# Patient Record
Sex: Female | Born: 1950 | Race: Black or African American | Hispanic: No | Smoking: Never smoker
Health system: Southern US, Community
[De-identification: ages and names within clinical notes are randomized; demographics above are authoritative.]

## PROBLEM LIST (undated history)

## (undated) DIAGNOSIS — K219 Gastro-esophageal reflux disease without esophagitis: Secondary | ICD-10-CM

## (undated) DIAGNOSIS — M1990 Unspecified osteoarthritis, unspecified site: Secondary | ICD-10-CM

## (undated) DIAGNOSIS — I10 Essential (primary) hypertension: Secondary | ICD-10-CM

## (undated) DIAGNOSIS — E785 Hyperlipidemia, unspecified: Secondary | ICD-10-CM

## (undated) DIAGNOSIS — I519 Heart disease, unspecified: Secondary | ICD-10-CM

## (undated) HISTORY — DX: Gastro-esophageal reflux disease without esophagitis: K21.9

## (undated) HISTORY — PX: BREAST BIOPSY: SHX20

## (undated) HISTORY — PX: REPLACEMENT TOTAL KNEE BILATERAL: SUR1225

## (undated) HISTORY — PX: COLONOSCOPY: SHX174

## (undated) HISTORY — DX: Unspecified osteoarthritis, unspecified site: M19.90

## (undated) HISTORY — DX: Hyperlipidemia, unspecified: E78.5

## (undated) HISTORY — PX: ESOPHAGOGASTRODUODENOSCOPY: SHX1529

## (undated) HISTORY — DX: Essential (primary) hypertension: I10

## (undated) HISTORY — PX: JOINT REPLACEMENT: SHX530

---
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
## 2018-08-11 ENCOUNTER — Telehealth: Payer: Self-pay | Admitting: Internal Medicine

## 2018-08-11 NOTE — Telephone Encounter (Signed)
Copied from [place] [phone]. Topic: Appointment Scheduling - New Patient >> Aug 11, 2018 11:58 AM [name] wrote: New patient would like to be approved as a New Patient and scheduled for your office. Provider: Dr. Madilyn Hook is the wife of Mr. Julia [name] DOB [phone], also one of [name] Pt's  FYI: Pt only takes Amlodipine, Pravastatin and Bystolic and vitamins from previous provider in another area   Route to department's PEC pool.

## 2018-08-12 NOTE — Telephone Encounter (Signed)
Appt scheduled and new patient packet sent

## 2018-08-12 NOTE — Telephone Encounter (Signed)
Ok Thx 

## 2018-09-08 ENCOUNTER — Encounter: Payer: Self-pay | Admitting: Internal Medicine

## 2018-09-08 ENCOUNTER — Ambulatory Visit (INDEPENDENT_AMBULATORY_CARE_PROVIDER_SITE_OTHER): Payer: Medicare Other | Admitting: Internal Medicine

## 2018-09-08 ENCOUNTER — Other Ambulatory Visit: Payer: Self-pay

## 2018-09-08 DIAGNOSIS — Z0000 Encounter for general adult medical examination without abnormal findings: Secondary | ICD-10-CM

## 2018-09-08 DIAGNOSIS — R002 Palpitations: Secondary | ICD-10-CM | POA: Diagnosis not present

## 2018-09-08 DIAGNOSIS — I10 Essential (primary) hypertension: Secondary | ICD-10-CM | POA: Diagnosis not present

## 2018-09-08 DIAGNOSIS — M170 Bilateral primary osteoarthritis of knee: Secondary | ICD-10-CM

## 2018-09-08 DIAGNOSIS — E785 Hyperlipidemia, unspecified: Secondary | ICD-10-CM | POA: Insufficient documentation

## 2018-09-08 MED ORDER — PRAVASTATIN SODIUM 80 MG PO TABS: ORAL_TABLET | ORAL | 3 refills | Status: DC

## 2018-09-08 MED ORDER — AMLODIPINE BESYLATE 10 MG PO TABS: ORAL_TABLET | ORAL | 3 refills | Status: DC

## 2018-09-08 MED ORDER — BYSTOLIC 5 MG PO TABS: ORAL_TABLET | ORAL | 3 refills | Status: DC

## 2018-09-08 NOTE — Assessment & Plan Note (Addendum)
On Pravastatin Obtain labs

## 2018-09-08 NOTE — Patient Instructions (Signed)
If you have medicare related insurance (such as traditional Medicare, Blue Cross Medicare, United HealthCare Medicare, or similar), Please make an appointment at the scheduling desk with Jill, the Wellness Health Coach, for your Wellness visit in this office, which is a benefit with your insurance.  

## 2018-09-08 NOTE — Assessment & Plan Note (Signed)
H/o PAF w/surgery per pt 2017 Occ palpitations Card ref - Dr Stanford Breed

## 2018-09-08 NOTE — Progress Notes (Signed)
Subjective:  Patient ID: [name] [name], female    DOB: January 26, 1951  Age: 68 y.o. MRN: [id number]  CC: No chief complaint on file.   HPI [name] [name] presents for a new pt visit She is relocating from [facility].  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  Her medical records and other labs were reviewed by myself   Outpatient Medications Prior to Visit  Medication Sig Dispense Refill  . amLODipine (NORVASC) 10 MG tablet TK 1 T PO QD    . ASPIRIN 81 PO Take by mouth.    Marland Kitchen BIOTIN PO Take by mouth.    . BYSTOLIC 5 MG tablet TK 1 T PO QHS    . CALCIUM PO Take by mouth.    . Cholecalciferol (VITAMIN D3 PO) Take by mouth.    Marland Kitchen GARLIC PO Take by mouth.    . MULTIPLE VITAMIN PO Take by mouth.    . pravastatin (PRAVACHOL) 80 MG tablet TK 1 T PO QHS    . TURMERIC PO Take by mouth.    Marland Kitchen VITAMIN E PO Take by mouth.     No facility-administered medications prior to visit.     ROS: Review of Systems  Constitutional: Negative for activity change, appetite change, chills, fatigue and unexpected weight change.  HENT: Negative for congestion, mouth sores and sinus pressure.   Eyes: Negative for visual disturbance.  Respiratory: Negative for cough and chest tightness.   Gastrointestinal: Negative for abdominal pain and nausea.  Genitourinary: Negative for difficulty urinating, frequency and vaginal pain.  Musculoskeletal: Negative for back pain and gait problem.  Skin: Negative for pallor and rash.  Neurological: Negative for dizziness, tremors, weakness, numbness and headaches.  Psychiatric/Behavioral: Negative for confusion, sleep disturbance and suicidal ideas.     Objective:  BP 124/72 (BP Location: Left Arm, Patient Position: Sitting, Cuff Size: Large)   Pulse (!) 51   Temp 98.1 F (36.7 C) (Oral)   Ht 5' 7.5" (1.715 m)   Wt 198 lb (89.8 kg)   SpO2 97%   BMI 30.55 kg/m   BP Readings from Last 3 Encounters:  09/08/18 124/72    Wt Readings from Last 3 Encounters:  09/08/18 198 lb (89.8 kg)    Physical Exam Constitutional:      General: She is not in acute distress.    Appearance: She is well-developed.  HENT:     Head: Normocephalic.     Right Ear: External ear normal.     Left Ear: External ear normal.     Nose: Nose normal.  Eyes:     General:        Right eye: No discharge.        Left eye: No discharge.     Conjunctiva/sclera: Conjunctivae normal.     Pupils: Pupils are equal, round, and reactive to light.  Neck:     Musculoskeletal: Normal range of motion and neck supple.     Thyroid: No thyromegaly.     Vascular: No JVD.     Trachea: No tracheal deviation.  Cardiovascular:     Rate and Rhythm: Normal rate and regular rhythm.  Heart sounds: Normal heart sounds.  Pulmonary:     Effort: No respiratory distress.     Breath sounds: No stridor. No wheezing.  Abdominal:     General: Bowel sounds are normal. There is no distension.     Palpations: Abdomen is soft. There is no mass.     Tenderness: There is no abdominal tenderness. There is no guarding or rebound.  Musculoskeletal:        General: No tenderness.  Lymphadenopathy:     Cervical: No cervical adenopathy.  Skin:    Findings: No erythema or rash.  Neurological:     Cranial Nerves: No cranial nerve deficit.     Motor: No abnormal muscle tone.     Coordination: Coordination normal.     Deep Tendon Reflexes: Reflexes normal.  Psychiatric:        Behavior: Behavior normal.        Thought Content: Thought content normal.        Judgment: Judgment normal.   Both knees with surgical scars  No results found for: WBC, HGB, HCT, PLT, GLUCOSE, CHOL, TRIG, HDL,  LDLDIRECT, LDLCALC, ALT, AST, NA, K, CL, CREATININE, BUN, CO2, TSH, PSA, INR, GLUF, HGBA1C, MICROALBUR  Patient was never admitted.  Assessment & Plan:    Sonda PrimesAlex Kele Withem, MD

## 2018-09-12 ENCOUNTER — Encounter: Payer: Self-pay | Admitting: Internal Medicine

## 2018-09-12 DIAGNOSIS — M1990 Unspecified osteoarthritis, unspecified site: Secondary | ICD-10-CM | POA: Insufficient documentation

## 2018-09-12 DIAGNOSIS — Z0000 Encounter for general adult medical examination without abnormal findings: Secondary | ICD-10-CM | POA: Insufficient documentation

## 2018-09-12 NOTE — Assessment & Plan Note (Signed)
Chronic.  Status post bilateral total knee replacement

## 2018-09-12 NOTE — Assessment & Plan Note (Addendum)
Continue with amlodipine, Bystolic.  Lab work

## 2018-09-12 NOTE — Assessment & Plan Note (Signed)
C/o OA for a long time- s/p B TKR She is status post colonoscopy 2015, EGD 2012 She had a Pap smear PAP in 2019 She had an Ophthalmology exam in 2020 Schedule a Medicare well exam with Sharee Pimple

## 2019-02-06 DIAGNOSIS — Z789 Other specified health status: Secondary | ICD-10-CM | POA: Insufficient documentation

## 2019-02-06 NOTE — Progress Notes (Signed)
Cardiology Office Note   Date:  02/08/2019   ID:  Julia [name], DOB 1950-10-26, MRN [id number]  PCP:  Cassandria Anger, MD  Cardiologist:   No primary care provider on file. Referring:  Plotnikov, Evie Lacks, MD  Chief Complaint  Patient presents with  . Palpitations      History of Present Illness: Julia [name] is a 68 y.o. female who is referred by Plotnikov, Evie Lacks, MD for evaluation of palpitations.     I was able to review records from [place].  She has a history of paroxysmal atrial fibrillation with rapid ventricular rate.  I did see a stress perfusion study from 2019 with an EF of 66% and no evidence of ischemia.  I do see a Holter report with some evidence of tachycardia suggestive of atrial tachycardia and also short runs in therapy.  She had a CT angiography of her coronaries with no significant disease.  She did have a calcium score of 38.2.  She says that this work-up started when she can get a knee surgery and told him about palpitations.  She was told that the fibrillation she is and was and gabapentin.  As I look through her event monitor I do not see any evidence fibrillation is a brief run of tachycardia.  She otherwise is not really having a lot of palpitations.  She denies any chest pressure, neck or arm discomfort.  She does a lot of exercising.  She is not having any shortness of breath, PND or orthopnea.  She has no weight gain or edema.   Past Medical History:  Diagnosis Date  . Arthritis   . GERD (gastroesophageal reflux disease)   . Hyperlipidemia   . Hypertension     Past Surgical History:  Procedure Laterality Date  . BREAST BIOPSY    . JOINT REPLACEMENT     Both knees     Current Outpatient Medications  Medication Sig Dispense Refill  . amLODipine (NORVASC) 10 MG tablet TK 1 T PO QD 90 tablet 3  . ASPIRIN 81 PO Take by mouth.    Marland Kitchen BIOTIN PO Take by mouth.    . BYSTOLIC 5 MG tablet TK 1 T PO QHS 90 tablet 3  . CALCIUM PO Take by  mouth.    . Cholecalciferol (VITAMIN D3 PO) Take by mouth.    . Ferrous Gluconate (IRON 27 PO) Take by mouth.    Marland Kitchen GARLIC PO Take by mouth.    . MULTIPLE VITAMIN PO Take by mouth.    . Multiple Vitamins-Minerals (CENTRUM SILVER PO) Take by mouth.    . pravastatin (PRAVACHOL) 80 MG tablet TK 1 T PO QHS 90 tablet 3  . TURMERIC PO Take by mouth.    Marland Kitchen VITAMIN E PO Take by mouth.     No current facility-administered medications for this visit.    Allergies:   Patient has no allergy information on record.    Social History:  The patient  reports that she has never smoked. She has never used smokeless tobacco. She reports that she does not drink alcohol or use drugs.   Family History:  The patient's family history includes Arthritis in her mother; Cancer in her father; Diabetes in her sister; Drug abuse in her brother; Heart disease in her brother and mother; Hyperlipidemia in her mother; Hypertension in her brother, mother, and sister.    ROS:  Please see the history of present illness.   Otherwise, review of systems are  positive for none.   All other systems are reviewed and negative.    PHYSICAL EXAM: VS:  BP (!) 143/73   Pulse (!) 59   Temp (!) 97.3 F (36.3 C)   Ht 5' 7.5" (1.715 m)   Wt 205 lb (93 kg)   SpO2 99%   BMI 31.63 kg/m  , BMI Body mass index is 31.63 kg/m. GENERAL:  Well appearing HEENT:  Pupils equal round and reactive, fundi not visualized, oral mucosa unremarkable NECK:  No jugular venous distention, waveform within normal limits, carotid upstroke brisk and symmetric, no bruits, no thyromegaly LYMPHATICS:  No cervical, inguinal adenopathy LUNGS:  Clear to auscultation bilaterally BACK:  No CVA tenderness CHEST:  Unremarkable HEART:  PMI not displaced or sustained,S1 and S2 within normal limits, no S3, no S4, no clicks, no rubs, no murmurs ABD:  Flat, positive bowel sounds normal in frequency in pitch, no bruits, no rebound, no guarding, no midline pulsatile  mass, no hepatomegaly, no splenomegaly EXT:  2 plus pulses throughout, no edema, no cyanosis no clubbing SKIN:  No rashes no nodules NEURO:  Cranial nerves II through XII grossly intact, motor grossly intact throughout PSYCH:  Cognitively intact, oriented to person place and time    EKG:  EKG is ordered today. The ekg ordered today demonstrates sinus rhythm, rate 55, axis within normal limits, intervals within normal limits, no acute ST-T wave changes.   Recent Labs: No results found for requested labs within last 8760 hours.    Lipid Panel No results found for: CHOL, TRIG, HDL, CHOLHDL, VLDL, LDLCALC, LDLDIRECT    Wt Readings from Last 3 Encounters:  02/08/19 205 lb (93 kg)  09/08/18 198 lb (89.8 kg)      Other studies Reviewed: Additional studies/ records that were reviewed today include: Extensive records from [place]. Review of the above records demonstrates:  Please see elsewhere in the note.     ASSESSMENT AND PLAN:  PALPITATIONS:   She has had documented rhythms as above but these are not particularly problematic.  At this point no change in therapy.  CORONARY CALCIUM: She had no obstructive coronary disease.  We talked about risk reduction as below.  HTN: Her blood pressure is controlled.  No change in therapy.  DYSLIPIDEMIA: She does have an elevated LDL.  She is on some pravastatin.  However, we had a long conversation about diet.  She is experiencing better with this.  LDL was 136 we talked about goal of around 100.  COVID EDUCATION: We talked about the vaccine.  Current medicines are reviewed at length with the patient today.  The patient does not have concerns regarding medicines.  The following changes have been made:  no change  Labs/ tests ordered today include: None  Orders Placed This Encounter  Procedures  . EKG 12-Lead     Disposition:   FU with me in one year.     Signed, Rollene Rotunda, MD  02/08/2019 10:44 AM    [facility]

## 2019-02-08 ENCOUNTER — Other Ambulatory Visit: Payer: Self-pay

## 2019-02-08 ENCOUNTER — Encounter: Payer: Self-pay | Admitting: Cardiology

## 2019-02-08 ENCOUNTER — Ambulatory Visit (INDEPENDENT_AMBULATORY_CARE_PROVIDER_SITE_OTHER): Payer: Medicare Other | Admitting: Cardiology

## 2019-02-08 ENCOUNTER — Encounter (INDEPENDENT_AMBULATORY_CARE_PROVIDER_SITE_OTHER): Payer: Self-pay

## 2019-02-08 VITALS — BP 143/73 | HR 59 | Temp 97.3°F | Ht 67.5 in | Wt 205.0 lb

## 2019-02-08 DIAGNOSIS — Z789 Other specified health status: Secondary | ICD-10-CM | POA: Diagnosis not present

## 2019-02-08 DIAGNOSIS — R002 Palpitations: Secondary | ICD-10-CM | POA: Diagnosis not present

## 2019-02-08 NOTE — Patient Instructions (Signed)
Medication Instructions:  Your physician recommends that you continue on your current medications as directed. Please refer to the Current Medication list given to you today.  *If you need a refill on your cardiac medications before your next appointment, please call your pharmacy*  Lab Work: none If you have labs (blood work) drawn today and your tests are completely normal, you will receive your results only by: . MyChart Message (if you have MyChart) OR . A paper copy in the mail If you have any lab test that is abnormal or we need to change your treatment, we will call you to review the results.  Testing/Procedures: none  Follow-Up: At [facility], you and your health needs are our priority.  As part of our continuing mission to provide you with exceptional heart care, we have created designated Provider Care Teams.  These Care Teams include your primary Cardiologist (physician) and Advanced Practice Providers (APPs -  Physician Assistants and Nurse Practitioners) who all work together to provide you with the care you need, when you need it.  Your next appointment:   12 month(s)  The format for your next appointment:   Either In Person or Virtual  Provider:   DR. HOCHREIN 

## 2019-03-13 ENCOUNTER — Ambulatory Visit (INDEPENDENT_AMBULATORY_CARE_PROVIDER_SITE_OTHER): Payer: Medicare Other | Admitting: Internal Medicine

## 2019-03-13 ENCOUNTER — Encounter: Payer: Self-pay | Admitting: Internal Medicine

## 2019-03-13 ENCOUNTER — Other Ambulatory Visit: Payer: Self-pay

## 2019-03-13 DIAGNOSIS — I2510 Atherosclerotic heart disease of native coronary artery without angina pectoris: Secondary | ICD-10-CM | POA: Diagnosis not present

## 2019-03-13 DIAGNOSIS — R002 Palpitations: Secondary | ICD-10-CM

## 2019-03-13 DIAGNOSIS — I10 Essential (primary) hypertension: Secondary | ICD-10-CM | POA: Diagnosis not present

## 2019-03-13 DIAGNOSIS — E785 Hyperlipidemia, unspecified: Secondary | ICD-10-CM | POA: Diagnosis not present

## 2019-03-13 DIAGNOSIS — M170 Bilateral primary osteoarthritis of knee: Secondary | ICD-10-CM

## 2019-03-13 DIAGNOSIS — I2583 Coronary atherosclerosis due to lipid rich plaque: Secondary | ICD-10-CM

## 2019-03-13 MED ORDER — VITAMIN D3 50 MCG (2000 UT) PO CAPS: 2000.0000 [IU] | ORAL_CAPSULE | Freq: Every day | ORAL | 3 refills | Status: AC

## 2019-03-13 NOTE — Progress Notes (Signed)
Subjective:  Patient ID: Julia [name], female    DOB: 1950-09-30  Age: 69 y.o. MRN: [id number]  CC: No chief complaint on file.   HPI Julia [name] presents for HTN, OA, dyslipidemia C/o L foot pain and burning C/o being achy all the time  Outpatient Medications Prior to Visit  Medication Sig Dispense Refill  . amLODipine (NORVASC) 10 MG tablet TK 1 T PO QD 90 tablet 3  . ASPIRIN 81 PO Take by mouth.    Marland Kitchen BIOTIN PO Take by mouth.    . BYSTOLIC 5 MG tablet TK 1 T PO QHS 90 tablet 3  . CALCIUM PO Take by mouth.    . Cholecalciferol (VITAMIN D3 PO) Take by mouth.    . Ferrous Gluconate (IRON 27 PO) Take by mouth.    Marland Kitchen GARLIC PO Take by mouth.    . MULTIPLE VITAMIN PO Take by mouth.    . Multiple Vitamins-Minerals (CENTRUM SILVER PO) Take by mouth.    . pravastatin (PRAVACHOL) 80 MG tablet TK 1 T PO QHS 90 tablet 3  . TURMERIC PO Take by mouth.    Marland Kitchen VITAMIN E PO Take by mouth.     No facility-administered medications prior to visit.    ROS: Review of Systems  Constitutional: Positive for unexpected weight change. Negative for activity change, appetite change, chills and fatigue.  HENT: Negative for congestion, mouth sores and sinus pressure.   Eyes: Negative for visual disturbance.  Respiratory: Negative for cough and chest tightness.   Gastrointestinal: Negative for abdominal pain and nausea.  Genitourinary: Negative for difficulty urinating, frequency and vaginal pain.  Musculoskeletal: Positive for arthralgias. Negative for back pain and gait problem.  Skin: Negative for pallor and rash.  Neurological: Negative for dizziness, tremors, weakness, numbness and headaches.  Psychiatric/Behavioral: Negative for confusion, sleep disturbance and suicidal ideas.    Objective:  BP 130/80 (BP Location: Left Arm, Patient Position: Sitting, Cuff Size: Large)   Pulse (!) 58   Temp 98.6 F (37 C) (Oral)   Ht 5' 7.5" (1.715 m)   Wt 209 lb (94.8 kg)   SpO2 98%   BMI 32.25 kg/m    BP Readings from Last 3 Encounters:  03/13/19 130/80  02/08/19 (!) 143/73  09/08/18 124/72    Wt Readings from Last 3 Encounters:  03/13/19 209 lb (94.8 kg)  02/08/19 205 lb (93 kg)  09/08/18 198 lb (89.8 kg)    Physical Exam Constitutional:      General: She is not in acute distress.    Appearance: She is well-developed. She is obese.  HENT:     Head: Normocephalic.     Right Ear: External ear normal.     Left Ear: External ear normal.     Nose: Nose normal.  Eyes:     General:        Right eye: No discharge.        Left eye: No discharge.     Conjunctiva/sclera: Conjunctivae normal.     Pupils: Pupils are equal, round, and reactive to light.  Neck:     Thyroid: No thyromegaly.     Vascular: No JVD.     Trachea: No tracheal deviation.  Cardiovascular:     Rate and Rhythm: Normal rate and regular rhythm.     Heart sounds: Normal heart sounds.  Pulmonary:     Effort: No respiratory distress.     Breath sounds: No stridor. No wheezing.  Abdominal:  General: Bowel sounds are normal. There is no distension.     Palpations: Abdomen is soft. There is no mass.     Tenderness: There is no abdominal tenderness. There is no guarding or rebound.  Musculoskeletal:        General: No tenderness.     Cervical back: Normal range of motion and neck supple.  Lymphadenopathy:     Cervical: No cervical adenopathy.  Skin:    Findings: No erythema or rash.  Neurological:     Mental Status: She is oriented to person, place, and time.     Cranial Nerves: No cranial nerve deficit.     Motor: No abnormal muscle tone.     Coordination: Coordination normal.     Deep Tendon Reflexes: Reflexes normal.  Psychiatric:        Behavior: Behavior normal.        Thought Content: Thought content normal.        Judgment: Judgment normal.   L foot w/pain  No results found for: WBC, HGB, HCT, PLT, GLUCOSE, CHOL, TRIG, HDL, LDLDIRECT, LDLCALC, ALT, AST, NA, K, CL, CREATININE, BUN, CO2,  TSH, PSA, INR, GLUF, HGBA1C, MICROALBUR  Patient was never admitted.  Assessment & Plan:    Walker Kehr, MD

## 2019-03-13 NOTE — Assessment & Plan Note (Signed)
Per Dr Antoine Poche: "She has a history of paroxysmal atrial fibrillation with rapid ventricular rate.  I did see a stress perfusion study from 2019 with an EF of 66% and no evidence of ischemia.  I do see a Holter report with some evidence of tachycardia suggestive of atrial tachycardia and also short runs in therapy.  She had a CT angiography of her coronaries with no significant disease.  She did have a calcium score of 38.2."

## 2019-03-13 NOTE — Assessment & Plan Note (Signed)
Per Dr Hochrein: "She has a history of paroxysmal atrial fibrillation with rapid ventricular rate.  I did see a stress perfusion study from 2019 with an EF of 66% and no evidence of ischemia.  I do see a Holter report with some evidence of tachycardia suggestive of atrial tachycardia and also short runs in therapy.  She had a CT angiography of her coronaries with no significant disease.  She did have a calcium score of 38.2." 

## 2019-03-13 NOTE — Patient Instructions (Addendum)
Hold PRAVASTATIN x 2 weeks   These suggestions will probably help you to improve your metabolism if you are not overweight and to lose weight if you are overweight: 1.  Reduce your consumption of sugars and starches.  Eliminate high fructose corn syrup from your diet.  Reduce your consumption of processed foods.  For desserts try to have seasonal fruits, berries, nuts, cheeses or dark chocolate with more than 70% cacao. 2.  Do not snack 3.  You do not have to eat breakfast.  If you choose to have breakfast-eat plain greek yogurt, eggs, oatmeal (without sugar) 4.  Drink water, freshly brewed unsweetened tea (green, black or herbal) or coffee.  Do not drink sodas including diet sodas , juices, beverages sweetened with artificial sweeteners. 5.  Reduce your consumption of refined grains. 6.  Avoid protein drinks such as Optifast, Slim fast etc. Eat chicken, fish, meat, dairy and beans for your sources of protein 7.  Natural unprocessed fats like cold pressed virgin olive oil, butter, coconut oil are good for you.  Eat avocados 8.  Increase your consumption of fiber.  Fruits, berries, vegetables, whole grains, flaxseeds, Chia seeds, beans, popcorn, nuts, oatmeal are good sources of fiber 9.  Use vinegar in your diet, i.e. apple cider vinegar, red wine or balsamic vinegar 10.  You can try fasting.  For example you can skip breakfast and lunch every other day (24-hour fast) 11.  Stress reduction, good night sleep, relaxation, meditation, yoga and other physical activity is likely to help you to maintain low weight too. 12.  If you drink alcohol, limit your alcohol intake to no more than 2 drinks a day.   We are committed to keeping you informed about the COVID-19 vaccine.  As the vaccine continues to become available for each phase, we will ensure that patients who meet the criteria receive the information they need to access vaccination opportunities. Continue to check your MyChart account and  [email or web address] for updates. Please review the Phase 1b information below.  Following [place] [employer]'s guidelines for the distribution of COVID-19 vaccines we are pleased to share our plans to begin offering vaccines to those 75 and older (Phase 1b). Here are details of those plans:  [place] On Tuesday, Jan. 19, the [facility]) and [place] will begin large-scale COVID-19 vaccinations at the [facility]. The vaccinations are appointment only and for those 62 and older.  It is expected that 750 will initially be vaccinated per day at the [facility]. Capacity is expected to grow in the weeks ahead. However, the number of reservations accepted depends on the amount of vaccine available.  Online or Phone Registration Only Walk-ins will NOT be accepted.  [place] will open registration to local residents on Friday, January 15. The [facility] Department will announce additional appointments at a later date.   Health Department Registration [facility] residents only)  [place] (Any local residents)  Other Counties We are also working in partnership with county health agencies in [place], [place] and [place] counties to ensure continuing vaccination availability in alignment with state guidelines in the weeks and months ahead. Information on phase 1b COVID-19 vaccination clinics being offered by local county health agencies is provided in the website links below for your convenience:  [place]'s phase 1b vaccination guidelines, prioritizing those 75 and over as the next  eligible group to receive the COVID-19 vaccine, are detailed at https://www.carlson.net/.   Vaccine Safety and Effectiveness Clinical trials for the Pfizer COVID-19 vaccine involved 42,000 people and showed  that the vaccine is more than 95% effective in preventing COVID-19 with no serious safety concerns. Similar results have been reported for the Moderna COVID-19 vaccine. Side effects reported in the Pfizer clinical trials include a sore arm at the injection site, fatigue, headache, chills and fever. While side effects from the Pfizer COVID-19 vaccine are higher than for a typical flu vaccine, they are lower in many ways than side effects from the leading vaccine to prevent shingles. Side effects are signs that a vaccine is working and are related to your immune system being stimulated to produce antibodies against infection. Side effects from vaccination are far less significant than health impacts from COVID-19.  Staying Informed Pharmacists, infectious disease doctors, critical care nurses and other experts at [facility] continue to speak publicly through media interviews and direct communication with our patients and communities about the safety, effectiveness and importance of vaccines to eliminate COVID-19. In addition, reliable information on vaccine safety, effectiveness, side effects and more is available on the following websites:  [place] Department of Health and Human Services COVID-19 Vaccine Information Website.  U.S. Centers for Disease Control and Prevention COVID-19 Librarian, academic.  Staying Safe We agree with the CDC on what we can do to help our communities get back to normal: Getting "back to normal" is going to take all of our tools. If we use all the tools we have, we stand the best chance of getting our families, communities, schools and workplaces "back to normal" sooner:  Get vaccinated as soon as vaccines become available within the phase of the state's vaccination rollout plan for which you meet the eligibility criteria.  Wear a mask.  Stay 6 feet from others and avoid crowds.  Wash hands often.  For our most current information, please visit  [email or web address]

## 2019-03-13 NOTE — Assessment & Plan Note (Signed)
Hold Pravastatin x 2 wks Pt had a CTcoronary angio in [facility] 2020 Dr Antoine Poche

## 2019-03-13 NOTE — Assessment & Plan Note (Signed)
Worse Hold Pravastatin x 2 weeks

## 2019-03-24 ENCOUNTER — Telehealth: Payer: Self-pay | Admitting: Cardiology

## 2019-03-24 NOTE — Telephone Encounter (Signed)
We are recommending the COVID-19 vaccine to all of our patients. Cardiac medications (including blood thinners) should not deter anyone from being vaccinated and there is no need to hold any of those medications prior to vaccine administration.     Currently, there is a hotline to call (active 03/03/19) to schedule vaccination appointments as no walk-ins will be accepted.   Number: [phone].    If an appointment is not available please go to [email or web address] to sign up for notification when additional vaccine appointments are available.   If you have further questions or concerns about the vaccine process, please visit [email or web address] or contact your primary care physician.  Verbalized Understanding. 

## 2019-04-26 ENCOUNTER — Ambulatory Visit (INDEPENDENT_AMBULATORY_CARE_PROVIDER_SITE_OTHER): Payer: Medicare Other | Admitting: Internal Medicine

## 2019-04-26 ENCOUNTER — Telehealth: Payer: Self-pay

## 2019-04-26 ENCOUNTER — Other Ambulatory Visit: Payer: Self-pay

## 2019-04-26 ENCOUNTER — Encounter: Payer: Self-pay | Admitting: Internal Medicine

## 2019-04-26 DIAGNOSIS — E785 Hyperlipidemia, unspecified: Secondary | ICD-10-CM

## 2019-04-26 DIAGNOSIS — B359 Dermatophytosis, unspecified: Secondary | ICD-10-CM

## 2019-04-26 DIAGNOSIS — I10 Essential (primary) hypertension: Secondary | ICD-10-CM | POA: Diagnosis not present

## 2019-04-26 DIAGNOSIS — M170 Bilateral primary osteoarthritis of knee: Secondary | ICD-10-CM | POA: Diagnosis not present

## 2019-04-26 MED ORDER — LIVALO 1 MG PO TABS: 1.0000 mg | ORAL_TABLET | Freq: Every day | ORAL | 3 refills | Status: DC

## 2019-04-26 MED ORDER — AMLODIPINE BESYLATE 10 MG PO TABS: ORAL_TABLET | ORAL | 3 refills | Status: DC

## 2019-04-26 MED ORDER — BYSTOLIC 5 MG PO TABS: ORAL_TABLET | ORAL | 3 refills | Status: DC

## 2019-04-26 MED ORDER — KETOCONAZOLE 2 % EX CREA: 1.0000 "application " | TOPICAL_CREAM | Freq: Two times a day (BID) | CUTANEOUS | 1 refills | Status: DC

## 2019-04-26 MED ORDER — TRIAMCINOLONE ACETONIDE 0.5 % EX OINT: 1.0000 "application " | TOPICAL_OINTMENT | Freq: Four times a day (QID) | CUTANEOUS | 0 refills | Status: DC

## 2019-04-26 NOTE — Assessment & Plan Note (Addendum)
Vs contact allergy to nickel  Remove FitBit Use Ketoconazole cream Triamcinolone ointment if not

## 2019-04-26 NOTE — Assessment & Plan Note (Signed)
Handicapped form filled out 

## 2019-04-26 NOTE — Telephone Encounter (Signed)
1.Medication Requested:  amLODipine (NORVASC) 10 MG tablet  BYSTOLIC 5 MG tablet  2. Pharmacy (Name, Street, [place]): [facility] on [address]   3. On Med List: Yes  4. Last Visit with PCP:  3.3.21   5. Next visit date with PCP: 6.3.2021   6. 90 day supply   Agent: Please be advised that RX refills may take up to 3 business days. We ask that you follow-up with your pharmacy.

## 2019-04-26 NOTE — Assessment & Plan Note (Signed)
Amlodipine, Bystolic

## 2019-04-26 NOTE — Telephone Encounter (Signed)
RXs sent.

## 2019-04-26 NOTE — Assessment & Plan Note (Signed)
Start Pitavastatin.

## 2019-04-26 NOTE — Progress Notes (Signed)
Subjective:  Patient ID: Julia [name], female    DOB: [birth year]  Age: 69 y.o. MRN: [id number]  CC: No chief complaint on file.   HPI Julia [name] presents for R wrist rash x 2 wks - itching F/u myalgias - much better off Pravachol F/u dyslipidemia  Outpatient Medications Prior to Visit  Medication Sig Dispense Refill  . amLODipine (NORVASC) 10 MG tablet TK 1 T PO QD 90 tablet 3  . ASPIRIN 81 PO Take by mouth.    Marland Kitchen BIOTIN PO Take by mouth.    . BYSTOLIC 5 MG tablet TK 1 T PO QHS 90 tablet 3  . CALCIUM PO Take by mouth.    . Cholecalciferol (VITAMIN D3) 50 MCG (2000 UT) capsule Take 1 capsule (2,000 Units total) by mouth daily. 100 capsule 3  . Ferrous Gluconate (IRON 27 PO) Take by mouth.    Marland Kitchen GARLIC PO Take by mouth.    . MULTIPLE VITAMIN PO Take by mouth.    . Multiple Vitamins-Minerals (CENTRUM SILVER PO) Take by mouth.    . TURMERIC PO Take by mouth.    Marland Kitchen VITAMIN E PO Take by mouth.    . pravastatin (PRAVACHOL) 80 MG tablet TK 1 T PO QHS (Patient not taking: Reported on 04/26/2019) 90 tablet 3   No facility-administered medications prior to visit.    ROS: Review of Systems  Constitutional: Positive for fatigue. Negative for activity change, appetite change, chills and unexpected weight change.  HENT: Negative for congestion, mouth sores and sinus pressure.   Eyes: Negative for visual disturbance.  Respiratory: Negative for cough and chest tightness.   Gastrointestinal: Negative for abdominal pain and nausea.  Genitourinary: Negative for difficulty urinating, frequency and vaginal pain.  Musculoskeletal: Positive for back pain. Negative for gait problem.  Skin: Positive for rash. Negative for pallor.  Neurological: Negative for dizziness, tremors, weakness, numbness and headaches.  Psychiatric/Behavioral: Negative for confusion, sleep disturbance and suicidal ideas.    Objective:  BP 136/78 (BP Location: Left Arm, Patient Position: Sitting, Cuff Size: Large)    Pulse (!) 54   Temp 98.5 F (36.9 C) (Oral)   Ht 5' 7.5" (1.715 m)   Wt 211 lb (95.7 kg)   SpO2 97%   BMI 32.56 kg/m   BP Readings from Last 3 Encounters:  04/26/19 136/78  03/13/19 130/80  02/08/19 (!) 143/73    Wt Readings from Last 3 Encounters:  04/26/19 211 lb (95.7 kg)  03/13/19 209 lb (94.8 kg)  02/08/19 205 lb (93 kg)    Physical Exam Constitutional:      General: She is not in acute distress.    Appearance: She is well-developed. She is obese.  HENT:     Head: Normocephalic.     Right Ear: External ear normal.     Left Ear: External ear normal.     Nose: Nose normal.  Eyes:     General:        Right eye: No discharge.        Left eye: No discharge.     Conjunctiva/sclera: Conjunctivae normal.     Pupils: Pupils are equal, round, and reactive to light.  Neck:     Thyroid: No thyromegaly.     Vascular: No JVD.     Trachea: No tracheal deviation.  Cardiovascular:     Rate and Rhythm: Normal rate and regular rhythm.     Heart sounds: Normal heart sounds.  Pulmonary:     Effort: No  respiratory distress.     Breath sounds: No stridor. No wheezing.  Abdominal:     General: Bowel sounds are normal. There is no distension.     Palpations: Abdomen is soft. There is no mass.     Tenderness: There is no abdominal tenderness. There is no guarding or rebound.  Musculoskeletal:        General: Tenderness present.     Cervical back: Normal range of motion and neck supple.  Lymphadenopathy:     Cervical: No cervical adenopathy.  Skin:    Findings: Rash present. No erythema.  Neurological:     Mental Status: She is oriented to person, place, and time.     Cranial Nerves: No cranial nerve deficit.     Motor: No abnormal muscle tone.     Coordination: Coordination normal.     Gait: Gait abnormal.     Deep Tendon Reflexes: Reflexes normal.  Psychiatric:        Behavior: Behavior normal.        Thought Content: Thought content normal.        Judgment: Judgment  normal.   R wrist rash patch Stiff knees, LS spine     Assessment & Plan:    Sonda Primes, MD

## 2019-04-26 NOTE — Patient Instructions (Addendum)
Remove FitBit Use Ketoconazole cream x 1 week Triamcinolone ointment if not better

## 2019-06-09 ENCOUNTER — Telehealth: Payer: Self-pay

## 2019-06-09 NOTE — Telephone Encounter (Signed)
Key: [id number]

## 2019-07-27 ENCOUNTER — Ambulatory Visit (INDEPENDENT_AMBULATORY_CARE_PROVIDER_SITE_OTHER): Payer: Medicare Other | Admitting: Internal Medicine

## 2019-07-27 ENCOUNTER — Other Ambulatory Visit: Payer: Self-pay

## 2019-07-27 ENCOUNTER — Encounter: Payer: Self-pay | Admitting: Internal Medicine

## 2019-07-27 DIAGNOSIS — I10 Essential (primary) hypertension: Secondary | ICD-10-CM

## 2019-07-27 DIAGNOSIS — I2583 Coronary atherosclerosis due to lipid rich plaque: Secondary | ICD-10-CM | POA: Diagnosis not present

## 2019-07-27 DIAGNOSIS — E785 Hyperlipidemia, unspecified: Secondary | ICD-10-CM | POA: Diagnosis not present

## 2019-07-27 DIAGNOSIS — I2510 Atherosclerotic heart disease of native coronary artery without angina pectoris: Secondary | ICD-10-CM

## 2019-07-27 LAB — CBC WITH DIFFERENTIAL/PLATELET
Basophils Absolute: 0 10*3/uL (ref 0.0–0.1)
Basophils Relative: 1.2 % (ref 0.0–3.0)
Eosinophils Absolute: 0.1 10*3/uL (ref 0.0–0.7)
Eosinophils Relative: 4 % (ref 0.0–5.0)
HCT: 37 % (ref 36.0–46.0)
Hemoglobin: 12.3 g/dL (ref 12.0–15.0)
Lymphocytes Relative: 35.1 % (ref 12.0–46.0)
Lymphs Abs: 1.2 10*3/uL (ref 0.7–4.0)
MCHC: 33.3 g/dL (ref 30.0–36.0)
MCV: 90 fl (ref 78.0–100.0)
Monocytes Absolute: 0.4 10*3/uL (ref 0.1–1.0)
Monocytes Relative: 10.2 % (ref 3.0–12.0)
Neutro Abs: 1.7 10*3/uL (ref 1.4–7.7)
Neutrophils Relative %: 49.5 % (ref 43.0–77.0)
Platelets: 238 10*3/uL (ref 150.0–400.0)
RBC: 4.11 Mil/uL (ref 3.87–5.11)
RDW: 14 % (ref 11.5–15.5)
WBC: 3.5 10*3/uL — ABNORMAL LOW (ref 4.0–10.5)

## 2019-07-27 LAB — BASIC METABOLIC PANEL
BUN: 16 mg/dL (ref 6–23)
CO2: 27 mEq/L (ref 19–32)
Calcium: 9.5 mg/dL (ref 8.4–10.5)
Chloride: 105 mEq/L (ref 96–112)
Creatinine, Ser: 1.08 mg/dL (ref 0.40–1.20)
GFR: 61.25 mL/min (ref >60.00)
Glucose, Bld: 83 mg/dL (ref 70–99)
Potassium: 4.3 mEq/L (ref 3.5–5.1)
Sodium: 138 mEq/L (ref 135–145)

## 2019-07-27 LAB — HEPATIC FUNCTION PANEL
ALT: 17 U/L (ref 0–35)
AST: 26 U/L (ref 0–37)
Albumin: 4.2 g/dL (ref 3.5–5.2)
Alkaline Phosphatase: 58 U/L (ref 39–117)
Bilirubin, Direct: 0.1 mg/dL (ref 0.0–0.3)
Total Bilirubin: 0.5 mg/dL (ref 0.2–1.2)
Total Protein: 7.3 g/dL (ref 6.0–8.3)

## 2019-07-27 LAB — LIPID PANEL
Cholesterol: 267 mg/dL — ABNORMAL HIGH (ref 0–200)
HDL: 56.4 mg/dL (ref >39.00)
LDL Cholesterol: 183 mg/dL — ABNORMAL HIGH (ref 0–99)
NonHDL: 211.09
Total CHOL/HDL Ratio: 5
Triglycerides: 138 mg/dL (ref 0.0–149.0)
VLDL: 27.6 mg/dL (ref 0.0–40.0)

## 2019-07-27 LAB — URINALYSIS
Bilirubin Urine: NEGATIVE
Hgb urine dipstick: NEGATIVE
Ketones, ur: NEGATIVE
Leukocytes,Ua: NEGATIVE
Nitrite: NEGATIVE
Specific Gravity, Urine: 1.015 (ref 1.000–1.030)
Total Protein, Urine: NEGATIVE
Urine Glucose: NEGATIVE
Urobilinogen, UA: 0.2 (ref 0.0–1.0)
pH: 6.5 (ref 5.0–8.0)

## 2019-07-27 LAB — TSH: TSH: 1.24 u[IU]/mL (ref 0.35–4.50)

## 2019-07-27 NOTE — Progress Notes (Signed)
Subjective:  Patient ID: Julia [name], female    DOB: 01-10-1951  Age: 69 y.o. MRN: [id number]  CC: No chief complaint on file.   HPI Julia [name] presents for dyslipidemia - Livalo was too $$$. Pravastatin was stopped due to LE cramps (resolved) F/u HTN   Outpatient Medications Prior to Visit  Medication Sig Dispense Refill  . amLODipine (NORVASC) 10 MG tablet TK 1 T PO QD 90 tablet 3  . ASPIRIN 81 PO Take by mouth.    Marland Kitchen BIOTIN PO Take by mouth.    . BYSTOLIC 5 MG tablet TK 1 T PO QHS 90 tablet 3  . CALCIUM PO Take by mouth.    . Cholecalciferol (VITAMIN D3) 50 MCG (2000 UT) capsule Take 1 capsule (2,000 Units total) by mouth daily. 100 capsule 3  . Ferrous Gluconate (IRON 27 PO) Take by mouth.    Marland Kitchen GARLIC PO Take by mouth.    Marland Kitchen ketoconazole (NIZORAL) 2 % cream Apply 1 application topically 2 (two) times daily. 30 g 1  . MULTIPLE VITAMIN PO Take by mouth.    . Multiple Vitamins-Minerals (CENTRUM SILVER PO) Take by mouth.    . Omega-3 Fatty Acids (FISH OIL PO) Take by mouth.    . triamcinolone ointment (KENALOG) 0.5 % Apply 1 application topically 4 (four) times daily. 30 g 0  . TURMERIC PO Take by mouth.    Marland Kitchen VITAMIN E PO Take by mouth.    . Pitavastatin Calcium (LIVALO) 1 MG TABS Take 1 tablet (1 mg total) by mouth daily. (Patient not taking: Reported on 07/27/2019) 90 tablet 3   No facility-administered medications prior to visit.    ROS: Review of Systems  Constitutional: Negative for activity change, appetite change, chills, fatigue and unexpected weight change.  HENT: Negative for congestion, mouth sores and sinus pressure.   Eyes: Negative for visual disturbance.  Respiratory: Negative for cough and chest tightness.   Gastrointestinal: Negative for abdominal pain and nausea.  Genitourinary: Negative for difficulty urinating, frequency and vaginal pain.  Musculoskeletal: Negative for back pain and gait problem.  Skin: Negative for pallor and rash.  Neurological:  Negative for dizziness, tremors, weakness, numbness and headaches.  Psychiatric/Behavioral: Negative for confusion, sleep disturbance and suicidal ideas.    Objective:  BP 118/78 (BP Location: Left Arm, Patient Position: Sitting, Cuff Size: Large)   Pulse (!) 56   Temp 97.7 F (36.5 C) (Oral)   Ht 5' 7.5" (1.715 m)   Wt 207 lb (93.9 kg)   SpO2 98%   BMI 31.94 kg/m   BP Readings from Last 3 Encounters:  07/27/19 118/78  04/26/19 136/78  03/13/19 130/80    Wt Readings from Last 3 Encounters:  07/27/19 207 lb (93.9 kg)  04/26/19 211 lb (95.7 kg)  03/13/19 209 lb (94.8 kg)    Physical Exam Constitutional:      General: She is not in acute distress.    Appearance: She is well-developed.  HENT:     Head: Normocephalic.     Right Ear: External ear normal.     Left Ear: External ear normal.     Nose: Nose normal.  Eyes:     General:        Right eye: No discharge.        Left eye: No discharge.     Conjunctiva/sclera: Conjunctivae normal.     Pupils: Pupils are equal, round, and reactive to light.  Neck:     Thyroid: No thyromegaly.  Vascular: No JVD.     Trachea: No tracheal deviation.  Cardiovascular:     Rate and Rhythm: Normal rate and regular rhythm.     Heart sounds: Normal heart sounds.  Pulmonary:     Effort: No respiratory distress.     Breath sounds: No stridor. No wheezing.  Abdominal:     General: Bowel sounds are normal. There is no distension.     Palpations: Abdomen is soft. There is no mass.     Tenderness: There is no abdominal tenderness. There is no guarding or rebound.  Musculoskeletal:        General: No tenderness.     Cervical back: Normal range of motion and neck supple.  Lymphadenopathy:     Cervical: No cervical adenopathy.  Skin:    Findings: No erythema or rash.  Neurological:     Cranial Nerves: No cranial nerve deficit.     Motor: No abnormal muscle tone.     Coordination: Coordination normal.     Deep Tendon Reflexes:  Reflexes normal.  Psychiatric:        Behavior: Behavior normal.        Thought Content: Thought content normal.        Judgment: Judgment normal.   knees w/scars post op  No results found for: WBC, HGB, HCT, PLT, GLUCOSE, CHOL, TRIG, HDL, LDLDIRECT, LDLCALC, ALT, AST, NA, K, CL, CREATININE, BUN, CO2, TSH, PSA, INR, GLUF, HGBA1C, MICROALBUR  Patient was never admitted.  Assessment & Plan:    Walker Kehr, MD

## 2019-07-27 NOTE — Patient Instructions (Signed)
  Re-start Pravastatin 1/2 tablet a day

## 2019-07-27 NOTE — Assessment & Plan Note (Signed)
Livalo was too $$$. Pravastatin was stopped due to LE cramps (resolved) Re-start Pravastatin

## 2019-07-27 NOTE — Assessment & Plan Note (Signed)
Livalo was too $$$. Pravastatin was stopped due to LE cramps (resolved) Try to re-start Pravastatin

## 2019-07-27 NOTE — Assessment & Plan Note (Signed)
Amlodipine, Bystolic

## 2019-07-27 NOTE — Addendum Note (Signed)
Addended by: Miguel Aschoff on: 07/27/2019 09:37 AM   Modules accepted: Orders

## 2019-11-08 ENCOUNTER — Other Ambulatory Visit: Payer: Self-pay | Admitting: Internal Medicine

## 2020-01-30 ENCOUNTER — Ambulatory Visit: Payer: Medicare Other | Admitting: Internal Medicine

## 2020-02-08 ENCOUNTER — Encounter: Payer: Self-pay | Admitting: Internal Medicine

## 2020-02-08 ENCOUNTER — Other Ambulatory Visit: Payer: Self-pay

## 2020-02-08 ENCOUNTER — Ambulatory Visit (INDEPENDENT_AMBULATORY_CARE_PROVIDER_SITE_OTHER): Payer: Medicare Other | Admitting: Internal Medicine

## 2020-02-08 DIAGNOSIS — R002 Palpitations: Secondary | ICD-10-CM

## 2020-02-08 DIAGNOSIS — E785 Hyperlipidemia, unspecified: Secondary | ICD-10-CM

## 2020-02-08 DIAGNOSIS — M25511 Pain in right shoulder: Secondary | ICD-10-CM

## 2020-02-08 DIAGNOSIS — G8929 Other chronic pain: Secondary | ICD-10-CM

## 2020-02-08 DIAGNOSIS — I2510 Atherosclerotic heart disease of native coronary artery without angina pectoris: Secondary | ICD-10-CM

## 2020-02-08 DIAGNOSIS — I2583 Coronary atherosclerosis due to lipid rich plaque: Secondary | ICD-10-CM

## 2020-02-08 DIAGNOSIS — I10 Essential (primary) hypertension: Secondary | ICD-10-CM

## 2020-02-08 MED ORDER — BISOPROLOL FUMARATE 5 MG PO TABS: 5.0000 mg | ORAL_TABLET | Freq: Every day | ORAL | 3 refills | Status: DC

## 2020-02-08 MED ORDER — METHYLPREDNISOLONE 4 MG PO TBPK
ORAL_TABLET | ORAL | 0 refills | Status: DC
Start: 2020-02-08 — End: 2020-07-04

## 2020-02-08 NOTE — Assessment & Plan Note (Signed)
On Pravastatin 

## 2020-02-08 NOTE — Patient Instructions (Signed)
Rotator Cuff Tendinitis  Rotator cuff tendinitis is inflammation of the tough, cord-like bands that connect muscle to bone (tendons) in the rotator cuff. The rotator cuff includes all of the muscles and tendons that connect the arm to the shoulder. The rotator cuff holds the head of the upper arm bone (humerus) in the cup (fossa) of the shoulder blade (scapula). This condition can lead to a long-lasting (chronic) tear. The tear may be partial or complete. What are the causes? This condition is usually caused by overusing the rotator cuff. What increases the risk? This condition is more likely to develop in athletes and workers who frequently use their shoulder or reach over their heads. This can include activities such as:  Tennis.  Baseball or softball.  Swimming.  Construction work.  Painting. What are the signs or symptoms? Symptoms of this condition include:  Pain spreading (radiating) from the shoulder to the upper arm.  Swelling and tenderness in front of the shoulder.  Pain when reaching, pulling, or lifting the arm above the head.  Pain when lowering the arm from above the head.  Minor pain in the shoulder when resting.  Increased pain in the shoulder at night.  Difficulty placing the arm behind the back. How is this diagnosed? This condition is diagnosed with a medical history and physical exam. Tests may also be done, including:  X-rays.  MRI.  Ultrasounds.  CT or MR arthrogram. During this test, a contrast material is injected and then images are taken. How is this treated? Treatment for this condition depends on the severity of the condition. In less severe cases, treatment may include:  Rest. This may be done with a sling that holds the shoulder still (immobilization). Your health care provider may also recommend avoiding activities that involve lifting your arm over your head.  Icing the shoulder.  Anti-inflammatory medicines, such as aspirin or  ibuprofen. In more severe cases, treatment may include:  Physical therapy.  Steroid injections.  Surgery. Follow these instructions at home: If you have a sling:  Wear the sling as told by your health care provider. Remove it only as told by your health care provider.  Loosen the sling if your fingers tingle, become numb, or turn cold and blue.  Keep the sling clean.  If the sling is not waterproof, do not let it get wet. Remove it, if allowed, or cover it with a watertight covering when you take a bath or shower. Managing pain, stiffness, and swelling  If directed, put ice on the injured area. ? If you have a removable sling, remove it as told by your health care provider. ? Put ice in a plastic bag. ? Place a towel between your skin and the bag. ? Leave the ice on for 20 minutes, 2-3 times a day.  Move your fingers often to avoid stiffness and to lessen swelling.  Raise (elevate) the injured area above the level of your heart while you are lying down.  Find a comfortable sleeping position or sleep on a recliner, if available. Driving  Do not drive or use heavy machinery while taking prescription pain medicine.  Ask your health care provider when it is safe to drive if you have a sling on your arm. Activity  Rest your shoulder as told by your health care provider.  Return to your normal activities as told by your health care provider. Ask your health care provider what activities are safe for you.  Do any exercises or stretches as   told by your health care provider.  If you do repetitive overhead tasks, take small breaks in between and include stretching exercises as told by your health care provider. General instructions  Do not use any products that contain nicotine or tobacco, such as cigarettes and e-cigarettes. These can delay healing. If you need help quitting, ask your health care provider.  Take over-the-counter and prescription medicines only as told by your  health care provider.  Keep all follow-up visits as told by your health care provider. This is important. Contact a health care provider if:  Your pain gets worse.  You have new pain in your arm, hands, or fingers.  Your pain is not relieved with medicine or does not get better after 6 weeks of treatment.  You have cracking sensations when moving your shoulder in certain directions.  You hear a snapping sound after using your shoulder, followed by severe pain and weakness. Get help right away if:  Your arm, hand, or fingers are numb or tingling.  Your arm, hand, or fingers are swollen or painful or they turn white or blue. Summary  Rotator cuff tendinitis is inflammation of the tough, cord-like bands that connect muscle to bone (tendons) in the rotator cuff.  This condition is usually caused by overusing the rotator cuff, which includes all of the muscles and tendons that connect the arm to the shoulder.  This condition is more likely to develop in athletes and workers who frequently use their shoulder or reach over their heads.  Treatment generally includes rest, anti-inflammatory medicines, and icing. In some cases, physical therapy and steroid injections may be needed. In severe cases, surgery may be needed. This information is not intended to replace advice given to you by your health care provider. Make sure you discuss any questions you have with your health care provider. Document Revised: 06/03/2018 Document Reviewed: 01/27/2016 Elsevier Patient Education  2020 Elsevier Inc.  

## 2020-02-08 NOTE — Assessment & Plan Note (Addendum)
New.  R>L rot cuff severe pain PT if not better Medrol pack-risks of using steroids discussed. The patient has been to inject the shoulder.  See procedure

## 2020-02-08 NOTE — Assessment & Plan Note (Addendum)
We need to change meds due to cost.  Continue amlodipine. Bystolic -we will need to d/c due to cost $$$ We will start bisoprolol

## 2020-02-08 NOTE — Progress Notes (Signed)
Subjective:  Patient ID: Julia [name], female    DOB: [birth year]  Age: 69 y.o. MRN: [id number]  CC: Follow-up (6 MONTH F/U- Pt states the Bystolic is too expensive want to discuss changing to something else)   HPI Julia [name] presents for R shoulder pain w/ROM; R>L since June 2021 after gym F/u dyslipidemia, HTN  C/o Bystolic - too $$$    Outpatient Medications Prior to Visit  Medication Sig Dispense Refill  . amLODipine (NORVASC) 10 MG tablet TK 1 T PO QD 90 tablet 3  . ASPIRIN 81 PO Take by mouth.    Marland Kitchen BIOTIN PO Take by mouth.    . BYSTOLIC 5 MG tablet TK 1 T PO QHS 90 tablet 3  . CALCIUM PO Take by mouth.    . Cholecalciferol (VITAMIN D3) 50 MCG (2000 UT) capsule Take 1 capsule (2,000 Units total) by mouth daily. 100 capsule 3  . Ferrous Gluconate (IRON 27 PO) Take by mouth.    Marland Kitchen GARLIC PO Take by mouth.    . MULTIPLE VITAMIN PO Take by mouth.    . Multiple Vitamins-Minerals (CENTRUM SILVER PO) Take by mouth.    . Omega-3 Fatty Acids (FISH OIL PO) Take by mouth.    . pravastatin (PRAVACHOL) 80 MG tablet Take 40 mg by mouth daily.    . TURMERIC PO Take by mouth.    Marland Kitchen VITAMIN E PO Take by mouth.    Marland Kitchen ketoconazole (NIZORAL) 2 % cream Apply 1 application topically 2 (two) times daily. (Patient not taking: Reported on 02/08/2020) 30 g 1  . Pitavastatin Calcium (LIVALO) 1 MG TABS Take 1 tablet (1 mg total) by mouth daily. (Patient not taking: No sig reported) 90 tablet 3  . triamcinolone ointment (KENALOG) 0.5 % Apply 1 application topically 4 (four) times daily. (Patient not taking: Reported on 02/08/2020) 30 g 0   No facility-administered medications prior to visit.    ROS: Review of Systems  Constitutional: Negative for activity change, appetite change, chills, fatigue and unexpected weight change.  HENT: Negative for congestion, mouth sores and sinus pressure.   Eyes: Negative for visual disturbance.  Respiratory: Negative for cough and chest tightness.    Gastrointestinal: Negative for abdominal pain and nausea.  Genitourinary: Negative for difficulty urinating, frequency and vaginal pain.  Musculoskeletal: Positive for arthralgias. Negative for back pain and gait problem.  Skin: Negative for pallor and rash.  Neurological: Negative for dizziness, tremors, weakness, numbness and headaches.  Psychiatric/Behavioral: Negative for confusion, sleep disturbance and suicidal ideas.    Objective:  BP 138/80 (BP Location: Left Arm)   Pulse (!) 54   Temp 98.7 F (37.1 C) (Oral)   Wt 206 lb 12.8 oz (93.8 kg)   SpO2 98%   BMI 31.91 kg/m   BP Readings from Last 3 Encounters:  02/08/20 138/80  07/27/19 118/78  04/26/19 136/78    Wt Readings from Last 3 Encounters:  02/08/20 206 lb 12.8 oz (93.8 kg)  07/27/19 207 lb (93.9 kg)  04/26/19 211 lb (95.7 kg)    Physical Exam Constitutional:      General: She is not in acute distress.    Appearance: She is well-developed.  HENT:     Head: Normocephalic.     Right Ear: External ear normal.     Left Ear: External ear normal.     Nose: Nose normal.     Mouth/Throat:     Mouth: Oropharynx is clear and moist.  Eyes:  General:        Right eye: No discharge.        Left eye: No discharge.     Conjunctiva/sclera: Conjunctivae normal.     Pupils: Pupils are equal, round, and reactive to light.  Neck:     Thyroid: No thyromegaly.     Vascular: No JVD.     Trachea: No tracheal deviation.  Cardiovascular:     Rate and Rhythm: Normal rate and regular rhythm.     Heart sounds: Normal heart sounds.  Pulmonary:     Effort: No respiratory distress.     Breath sounds: No stridor. No wheezing.  Abdominal:     General: Bowel sounds are normal. There is no distension.     Palpations: Abdomen is soft. There is no mass.     Tenderness: There is no abdominal tenderness. There is no guarding or rebound.  Musculoskeletal:        General: No tenderness or edema.     Cervical back: Normal range of  motion and neck supple.  Lymphadenopathy:     Cervical: No cervical adenopathy.  Skin:    Findings: No erythema or rash.  Neurological:     Cranial Nerves: No cranial nerve deficit.     Motor: No abnormal muscle tone.     Coordination: Coordination normal.     Deep Tendon Reflexes: Reflexes normal.  Psychiatric:        Mood and Affect: Mood and affect normal.        Behavior: Behavior normal.        Thought Content: Thought content normal.        Judgment: Judgment normal.   R subacr space - very tender w/ROM   Procedure :Joint Injection, right shoulder   Indication:  Subacromial bursitis with refractory severe pain.   Risks including unsuccessful procedure , bleeding, infection, bruising, skin atrophy, "steroid flare-up" and others were explained to the patient in detail as well as the benefits. Informed consent was obtained and signed.   Tthe patient was placed in a comfortable position. Lateral approach was used. Skin was prepped with Betadine and alcohol  and anesthetized with a cooling spray. Then, a 5 cc syringe with a 2 inch long 24-gauge needle was used for a joint injection.. The needle was advanced  Into the subacromial space.The bursa was injected with 3 mL of 2% lidocaine and 40 mg of Depo-Medrol .  Band-Aid was applied.   Tolerated well. Complications: None. Good pain relief following the procedure.   Postprocedure instructions :    A Band-Aid should be left on for 12 hours. Injection therapy is not a cure itself. It is used in conjunction with other modalities. You can use nonsteroidal anti-inflammatories like ibuprofen , hot and cold compresses. Rest is recommended in the next 24 hours. You need to report immediately  if fever, chills or any signs of infection develop.    Lab Results  Component Value Date   WBC 3.5 (L) 07/27/2019   HGB 12.3 07/27/2019   HCT 37.0 07/27/2019   PLT 238.0 07/27/2019   GLUCOSE 83 07/27/2019   CHOL 267 (H) 07/27/2019   TRIG 138.0  07/27/2019   HDL 56.40 07/27/2019   LDLCALC 183 (H) 07/27/2019   ALT 17 07/27/2019   AST 26 07/27/2019   NA 138 07/27/2019   K 4.3 07/27/2019   CL 105 07/27/2019   CREATININE 1.08 07/27/2019   BUN 16 07/27/2019   CO2 27 07/27/2019   TSH  1.24 07/27/2019    Patient was never admitted.  Assessment & Plan:    Sonda Primes, MD

## 2020-02-08 NOTE — Assessment & Plan Note (Addendum)
Continue with pravastatin Monitor lipids and liver tests

## 2020-02-08 NOTE — Assessment & Plan Note (Signed)
Bisoprol

## 2020-02-11 ENCOUNTER — Encounter: Payer: Self-pay | Admitting: Internal Medicine

## 2020-03-21 ENCOUNTER — Ambulatory Visit: Payer: Medicare Other | Admitting: Internal Medicine

## 2020-05-24 ENCOUNTER — Other Ambulatory Visit: Payer: Self-pay | Admitting: Internal Medicine

## 2020-05-27 ENCOUNTER — Telehealth: Payer: Self-pay | Admitting: Internal Medicine

## 2020-05-27 MED ORDER — TRIAMCINOLONE ACETONIDE 0.5 % EX OINT: 1.0000 "application " | TOPICAL_OINTMENT | Freq: Two times a day (BID) | CUTANEOUS | 0 refills | Status: AC

## 2020-05-27 MED ORDER — PRAVASTATIN SODIUM 80 MG PO TABS: 40.0000 mg | ORAL_TABLET | Freq: Every day | ORAL | 0 refills | Status: DC

## 2020-05-27 NOTE — Telephone Encounter (Signed)
1.Medication Requested: pravastatin (PRAVACHOL) 80 MG tablet  triamcinolone ointment (KENALOG) 0.5 %    2. Pharmacy (Name, Street, [place]): [facility] - [place], [place] - [address]  3. On Med List:  4. Last Visit with PCP: 02-08-20  5. Next visit date with PCP: n/a   Agent: Please be advised that RX refills may take up to 3 business days. We ask that you follow-up with your pharmacy.

## 2020-05-27 NOTE — Telephone Encounter (Signed)
Reviewed chart pt is up-to-date sent refills to [facility].../lmb  

## 2020-07-03 DIAGNOSIS — R931 Abnormal findings on diagnostic imaging of heart and coronary circulation: Secondary | ICD-10-CM | POA: Insufficient documentation

## 2020-07-03 NOTE — Progress Notes (Signed)
Cardiology Office Note   Date:  07/04/2020   ID:  Julia [name], DOB 1950-07-05, MRN [id number]  PCP:  Tresa Garter, MD  Cardiologist:   None Referring:  Plotnikov, Georgina Quint, MD  Chief Complaint  Patient presents with  . Shortness of Breath      History of Present Illness: Julia [name] is a 70 y.o. female who is referred by Plotnikov, Georgina Quint, MD for evaluation of palpitations.   She has a history of paroxysmal atrial fibrillation with rapid ventricular rate.  She had a perfusion study from 2019 with an EF of 66% and no evidence of ischemia.  I do see a Holter report with some evidence of tachycardia suggestive of atrial tachycardia and also short runs in therapy.  She had a CT angiography of her coronaries with no significant disease.  She did have a calcium score of 38.2.  Since I last saw her she is walking 3 days a week.  She works at [employer] for 45 minutes.  She was cleared for some other activities.  When she first walks up an incline working on that she gets shortness of breath but otherwise she denies any cardiovascular symptoms.  She does not think this is new or worse than previous.  She had no chest pressure, neck or arm discomfort.  She had no new palpitations, presyncope or syncope.   Past Medical History:  Diagnosis Date  . Arthritis   . GERD (gastroesophageal reflux disease)   . Hyperlipidemia   . Hypertension     Past Surgical History:  Procedure Laterality Date  . BREAST BIOPSY    . JOINT REPLACEMENT     Both knees     Current Outpatient Medications  Medication Sig Dispense Refill  . amLODipine (NORVASC) 10 MG tablet Take 1 tablet by mouth once daily Annual appt due in June must see provider for future refills 90 tablet 0  . ASPIRIN 81 PO Take by mouth.    Marland Kitchen BIOTIN PO Take by mouth.    Marland Kitchen CALCIUM PO Take by mouth.    . Cholecalciferol (VITAMIN D3) 50 MCG (2000 UT) capsule Take 1 capsule (2,000 Units total) by mouth daily. 100 capsule 3  .  Ferrous Gluconate (IRON 27 PO) Take by mouth.    Marland Kitchen GARLIC PO Take by mouth.    . meloxicam (MOBIC) 15 MG tablet Take 1 tablet by mouth daily as needed.    . Multiple Vitamins-Minerals (CENTRUM SILVER PO) Take by mouth.    . nebivolol (BYSTOLIC) 5 MG tablet Take 1 tablet (5 mg total) by mouth at bedtime. Annual appt due in June must see provider for future refills 90 tablet 0  . Omega-3 Fatty Acids (FISH OIL PO) Take by mouth.    . pravastatin (PRAVACHOL) 80 MG tablet Take 0.5 tablets (40 mg total) by mouth daily. Annual appt due in June must see provider for future refills 45 tablet 0  . TURMERIC PO Take by mouth.    Marland Kitchen VITAMIN E PO Take by mouth.     No current facility-administered medications for this visit.    Allergies:   Pravastatin    ROS:  Please see the history of present illness.   Otherwise, review of systems are positive for none.   All other systems are reviewed and negative.    PHYSICAL EXAM: VS:  BP 130/70   Pulse (!) 51   Ht 5\' 11"  (1.803 m)   Wt 210 lb (95.3 kg)  SpO2 99%   BMI 29.29 kg/m  , BMI Body mass index is 29.29 kg/m. GENERAL:  Well appearing NECK:  No jugular venous distention, waveform within normal limits, carotid upstroke brisk and symmetric, no bruits, no thyromegaly LUNGS:  Clear to auscultation bilaterally CHEST:  Unremarkable HEART:  PMI not displaced or sustained,S1 and S2 within normal limits, no S3, no S4, no clicks, no rubs, no murmurs ABD:  Flat, positive bowel sounds normal in frequency in pitch, no bruits, no rebound, no guarding, no midline pulsatile mass, no hepatomegaly, no splenomegaly EXT:  2 plus pulses throughout, mild nonpitting bilateral lower extremity edema, no cyanosis no clubbing   Recent Labs: 07/27/2019: ALT 17; BUN 16; Creatinine, Ser 1.08; Hemoglobin 12.3; Platelets 238.0; Potassium 4.3; Sodium 138; TSH 1.24    Lipid Panel    Component Value Date/Time   CHOL 267 (H) 07/27/2019 0937   TRIG 138.0 07/27/2019 0937   HDL  56.40 07/27/2019 0937   CHOLHDL 5 07/27/2019 0937   VLDL 27.6 07/27/2019 0937   LDLCALC 183 (H) 07/27/2019 0937      Wt Readings from Last 3 Encounters:  07/04/20 210 lb (95.3 kg)  02/08/20 206 lb 12.8 oz (93.8 kg)  07/27/19 207 lb (93.9 kg)    EKG: Sinus rhythm, rate 51, axis within normal limits, intervals within normal limits, no acute ST-T wave changes.  Other studies Reviewed: Additional studies/ records that were reviewed today include: Echo, POET (Plain Old Exercise Treadmill) Review of the above records demonstrates:  Please see elsewhere in the note.     ASSESSMENT AND PLAN:  PALPITATIONS:    She has had no documented atrial fibrillation I can see on her monitor from 2019.  She has had some SVT noted which she is optically bothered by these.  She has had some brief runs of NSVT in 2019.  No change in therapy.   CORONARY CALCIUM: She had no obstructive coronary disease.  We talked about this quite a bit.  In the absence of new symptoms I do not think further testing is indicated.   HTN: Her blood pressure is controlled.  No change in therapy.    DYSLIPIDEMIA: She was not sure whether she was taking the 80 mg of pravastatin when she had her lipids most recently.  I will check these again it is likely she will need a different statin I think with a goal LDL less than 100.   Current medicines are reviewed at length with the patient today.  The patient does not have concerns regarding medicines.  The following changes have been made:  None  Labs/ tests ordered today include:   Orders Placed This Encounter  Procedures  . Lipid panel  . EKG 12-Lead     Disposition:   FU with me in one year.    Signed, Rollene Rotunda, MD  07/04/2020 9:53 AM    [facility]

## 2020-07-04 ENCOUNTER — Encounter: Payer: Self-pay | Admitting: Cardiology

## 2020-07-04 ENCOUNTER — Other Ambulatory Visit: Payer: Self-pay

## 2020-07-04 ENCOUNTER — Ambulatory Visit (INDEPENDENT_AMBULATORY_CARE_PROVIDER_SITE_OTHER): Payer: Medicare Other | Admitting: Cardiology

## 2020-07-04 VITALS — BP 130/70 | HR 51 | Ht 71.0 in | Wt 210.0 lb

## 2020-07-04 DIAGNOSIS — E785 Hyperlipidemia, unspecified: Secondary | ICD-10-CM | POA: Diagnosis not present

## 2020-07-04 DIAGNOSIS — R002 Palpitations: Secondary | ICD-10-CM | POA: Diagnosis not present

## 2020-07-04 DIAGNOSIS — R931 Abnormal findings on diagnostic imaging of heart and coronary circulation: Secondary | ICD-10-CM | POA: Diagnosis not present

## 2020-07-04 DIAGNOSIS — I10 Essential (primary) hypertension: Secondary | ICD-10-CM | POA: Diagnosis not present

## 2020-07-04 LAB — LIPID PANEL
Chol/HDL Ratio: 3.9 ratio (ref 0.0–4.4)
Cholesterol, Total: 215 mg/dL — ABNORMAL HIGH (ref 100–199)
HDL: 55 mg/dL (ref >39)
LDL Chol Calc (NIH): 143 mg/dL — ABNORMAL HIGH (ref 0–99)
Triglycerides: 95 mg/dL (ref 0–149)
VLDL Cholesterol Cal: 17 mg/dL (ref 5–40)

## 2020-07-04 NOTE — Patient Instructions (Signed)

## 2020-07-10 ENCOUNTER — Other Ambulatory Visit: Payer: Self-pay

## 2020-07-10 MED ORDER — ATORVASTATIN CALCIUM 40 MG PO TABS: 40.0000 mg | ORAL_TABLET | Freq: Every day | ORAL | 3 refills | Status: DC

## 2020-09-06 ENCOUNTER — Other Ambulatory Visit: Payer: Self-pay | Admitting: Internal Medicine

## 2020-11-05 ENCOUNTER — Encounter: Payer: Self-pay | Admitting: Internal Medicine

## 2020-11-05 ENCOUNTER — Ambulatory Visit (INDEPENDENT_AMBULATORY_CARE_PROVIDER_SITE_OTHER): Payer: Medicare Other | Admitting: Internal Medicine

## 2020-11-05 ENCOUNTER — Other Ambulatory Visit: Payer: Self-pay

## 2020-11-05 VITALS — BP 130/72 | HR 55 | Temp 98.4°F | Wt 208.2 lb

## 2020-11-05 DIAGNOSIS — Z1231 Encounter for screening mammogram for malignant neoplasm of breast: Secondary | ICD-10-CM

## 2020-11-05 DIAGNOSIS — I2583 Coronary atherosclerosis due to lipid rich plaque: Secondary | ICD-10-CM

## 2020-11-05 DIAGNOSIS — Z23 Encounter for immunization: Secondary | ICD-10-CM | POA: Diagnosis not present

## 2020-11-05 DIAGNOSIS — Z0000 Encounter for general adult medical examination without abnormal findings: Secondary | ICD-10-CM | POA: Diagnosis not present

## 2020-11-05 DIAGNOSIS — R931 Abnormal findings on diagnostic imaging of heart and coronary circulation: Secondary | ICD-10-CM | POA: Diagnosis not present

## 2020-11-05 DIAGNOSIS — I2510 Atherosclerotic heart disease of native coronary artery without angina pectoris: Secondary | ICD-10-CM

## 2020-11-05 DIAGNOSIS — E785 Hyperlipidemia, unspecified: Secondary | ICD-10-CM | POA: Diagnosis not present

## 2020-11-05 DIAGNOSIS — I10 Essential (primary) hypertension: Secondary | ICD-10-CM | POA: Diagnosis not present

## 2020-11-05 DIAGNOSIS — G8929 Other chronic pain: Secondary | ICD-10-CM

## 2020-11-05 DIAGNOSIS — M25511 Pain in right shoulder: Secondary | ICD-10-CM

## 2020-11-05 LAB — COMPREHENSIVE METABOLIC PANEL
ALT: 19 U/L (ref 0–35)
AST: 26 U/L (ref 0–37)
Albumin: 4.3 g/dL (ref 3.5–5.2)
Alkaline Phosphatase: 66 U/L (ref 39–117)
BUN: 21 mg/dL (ref 6–23)
CO2: 26 mEq/L (ref 19–32)
Calcium: 9.9 mg/dL (ref 8.4–10.5)
Chloride: 106 mEq/L (ref 96–112)
Creatinine, Ser: 1.21 mg/dL — ABNORMAL HIGH (ref 0.40–1.20)
GFR: 46.16 mL/min — ABNORMAL LOW (ref >60.00)
Glucose, Bld: 75 mg/dL (ref 70–99)
Potassium: 3.7 mEq/L (ref 3.5–5.1)
Sodium: 141 mEq/L (ref 135–145)
Total Bilirubin: 0.5 mg/dL (ref 0.2–1.2)
Total Protein: 8 g/dL (ref 6.0–8.3)

## 2020-11-05 LAB — LIPID PANEL
Cholesterol: 191 mg/dL (ref 0–200)
HDL: 56.2 mg/dL (ref >39.00)
LDL Cholesterol: 110 mg/dL — ABNORMAL HIGH (ref 0–99)
NonHDL: 134.33
Total CHOL/HDL Ratio: 3
Triglycerides: 123 mg/dL (ref 0.0–149.0)
VLDL: 24.6 mg/dL (ref 0.0–40.0)

## 2020-11-05 LAB — CBC WITH DIFFERENTIAL/PLATELET
Basophils Absolute: 0 10*3/uL (ref 0.0–0.1)
Basophils Relative: 0.7 % (ref 0.0–3.0)
Eosinophils Absolute: 0.1 10*3/uL (ref 0.0–0.7)
Eosinophils Relative: 2.3 % (ref 0.0–5.0)
HCT: 38 % (ref 36.0–46.0)
Hemoglobin: 12.4 g/dL (ref 12.0–15.0)
Lymphocytes Relative: 33.6 % (ref 12.0–46.0)
Lymphs Abs: 1.9 10*3/uL (ref 0.7–4.0)
MCHC: 32.7 g/dL (ref 30.0–36.0)
MCV: 88.1 fl (ref 78.0–100.0)
Monocytes Absolute: 0.5 10*3/uL (ref 0.1–1.0)
Monocytes Relative: 8.6 % (ref 3.0–12.0)
Neutro Abs: 3.2 10*3/uL (ref 1.4–7.7)
Neutrophils Relative %: 54.8 % (ref 43.0–77.0)
Platelets: 254 10*3/uL (ref 150.0–400.0)
RBC: 4.32 Mil/uL (ref 3.87–5.11)
RDW: 14.5 % (ref 11.5–15.5)
WBC: 5.8 10*3/uL (ref 4.0–10.5)

## 2020-11-05 LAB — URINALYSIS
Bilirubin Urine: NEGATIVE
Hgb urine dipstick: NEGATIVE
Leukocytes,Ua: NEGATIVE
Nitrite: NEGATIVE
Specific Gravity, Urine: >=1.03 — AB (ref 1.000–1.030)
Urine Glucose: NEGATIVE
Urobilinogen, UA: 0.2 (ref 0.0–1.0)
pH: 6 (ref 5.0–8.0)

## 2020-11-05 LAB — TSH: TSH: 1.19 u[IU]/mL (ref 0.35–5.50)

## 2020-11-05 MED ORDER — AMLODIPINE BESYLATE 10 MG PO TABS: ORAL_TABLET | ORAL | 3 refills | Status: DC

## 2020-11-05 MED ORDER — NEBIVOLOL HCL 5 MG PO TABS: 5.0000 mg | ORAL_TABLET | Freq: Every day | ORAL | 3 refills | Status: DC

## 2020-11-05 MED ORDER — ATORVASTATIN CALCIUM 40 MG PO TABS: 40.0000 mg | ORAL_TABLET | Freq: Every day | ORAL | 3 refills | Status: DC

## 2020-11-05 NOTE — Assessment & Plan Note (Signed)
On Lipitor 

## 2020-11-05 NOTE — Assessment & Plan Note (Signed)
On Lipitor Will check lipids

## 2020-11-05 NOTE — Addendum Note (Signed)
Addended by: Waldemar Dickens B on: 11/05/2020 02:59 PM   Modules accepted: Orders

## 2020-11-05 NOTE — Addendum Note (Signed)
Addended by: Deatra James on: 11/05/2020 02:59 PM   Modules accepted: Orders

## 2020-11-05 NOTE — Assessment & Plan Note (Signed)
On lipitor

## 2020-11-05 NOTE — Progress Notes (Signed)
Subjective:  Patient ID: Julia [name], female    DOB: 28-Sep-1950  Age: 71 y.o. MRN: [id number]  CC: Follow-up (Pt need labs- Req refill on atorvastatin)   HPI Julia [name] presents for dyslipidemia, HTN, OA f/u Well exam  Outpatient Medications Prior to Visit  Medication Sig Dispense Refill   amLODipine (NORVASC) 10 MG tablet TAKE 1 TABLET BY MOUTH ONCE DAILY . Overdue for Annual appt must see provider for future refills 30 tablet 0   ASPIRIN 81 PO Take by mouth.     BIOTIN PO Take by mouth.     CALCIUM PO Take by mouth.     Cholecalciferol (VITAMIN D3) 50 MCG (2000 UT) capsule Take 1 capsule (2,000 Units total) by mouth daily. 100 capsule 3   Ferrous Gluconate (IRON 27 PO) Take by mouth.     GARLIC PO Take by mouth.     Multiple Vitamins-Minerals (CENTRUM SILVER PO) Take by mouth.     nebivolol (BYSTOLIC) 5 MG tablet Take 1 tablet (5 mg total) by mouth at bedtime. Annual appt due in June must see provider for future refills 90 tablet 0   Omega-3 Fatty Acids (FISH OIL PO) Take by mouth.     TURMERIC PO Take by mouth.     VITAMIN E PO Take by mouth.     atorvastatin (LIPITOR) 40 MG tablet Take 1 tablet (40 mg total) by mouth daily. 30 tablet 3   meloxicam (MOBIC) 15 MG tablet Take 1 tablet by mouth daily as needed. (Patient not taking: Reported on 11/05/2020)     No facility-administered medications prior to visit.    ROS: Review of Systems  Constitutional:  Negative for activity change, appetite change, chills, fatigue and unexpected weight change.  HENT:  Negative for congestion, mouth sores and sinus pressure.   Eyes:  Negative for visual disturbance.  Respiratory:  Negative for cough and chest tightness.   Gastrointestinal:  Negative for abdominal pain and nausea.  Genitourinary:  Negative for difficulty urinating, frequency and vaginal pain.  Musculoskeletal:  Positive for arthralgias and back pain. Negative for gait problem.  Skin:  Negative for pallor and rash.   Neurological:  Negative for dizziness, tremors, weakness, numbness and headaches.  Psychiatric/Behavioral:  Negative for confusion and sleep disturbance.    Objective:  There were no vitals taken for this visit.  BP Readings from Last 3 Encounters:  07/04/20 130/70  02/08/20 138/80  07/27/19 118/78    Wt Readings from Last 3 Encounters:  07/04/20 210 lb (95.3 kg)  02/08/20 206 lb 12.8 oz (93.8 kg)  07/27/19 207 lb (93.9 kg)    Physical Exam Constitutional:      General: She is not in acute distress.    Appearance: She is well-developed.  HENT:     Head: Normocephalic.     Right Ear: External ear normal.     Left Ear: External ear normal.     Nose: Nose normal.  Eyes:     General:        Right eye: No discharge.        Left eye: No discharge.     Conjunctiva/sclera: Conjunctivae normal.     Pupils: Pupils are equal, round, and reactive to light.  Neck:     Thyroid: No thyromegaly.     Vascular: No JVD.     Trachea: No tracheal deviation.  Cardiovascular:     Rate and Rhythm: Normal rate and regular rhythm.     Heart sounds: Normal  heart sounds.  Pulmonary:     Effort: No respiratory distress.     Breath sounds: No stridor. No wheezing.  Abdominal:     General: Bowel sounds are normal. There is no distension.     Palpations: Abdomen is soft. There is no mass.     Tenderness: There is no abdominal tenderness. There is no guarding or rebound.  Musculoskeletal:        General: No tenderness.     Cervical back: Normal range of motion and neck supple. No rigidity.  Lymphadenopathy:     Cervical: No cervical adenopathy.  Skin:    Findings: No erythema or rash.  Neurological:     Cranial Nerves: No cranial nerve deficit.     Motor: No abnormal muscle tone.     Coordination: Coordination normal.     Deep Tendon Reflexes: Reflexes normal.  Psychiatric:        Behavior: Behavior normal.        Thought Content: Thought content normal.        Judgment: Judgment  normal.    Lab Results  Component Value Date   WBC 3.5 (L) 07/27/2019   HGB 12.3 07/27/2019   HCT 37.0 07/27/2019   PLT 238.0 07/27/2019   GLUCOSE 83 07/27/2019   CHOL 215 (H) 07/04/2020   TRIG 95 07/04/2020   HDL 55 07/04/2020   LDLCALC 143 (H) 07/04/2020   ALT 17 07/27/2019   AST 26 07/27/2019   NA 138 07/27/2019   K 4.3 07/27/2019   CL 105 07/27/2019   CREATININE 1.08 07/27/2019   BUN 16 07/27/2019   CO2 27 07/27/2019   TSH 1.24 07/27/2019    Patient was never admitted.  Assessment & Plan:     Sonda Primes, MD

## 2020-11-05 NOTE — Assessment & Plan Note (Addendum)
On Amlodipine, Bystolic Check CMET

## 2020-11-05 NOTE — Assessment & Plan Note (Signed)
Will get a mammo

## 2020-11-09 ENCOUNTER — Other Ambulatory Visit: Payer: Self-pay | Admitting: Internal Medicine

## 2020-11-09 DIAGNOSIS — R7989 Other specified abnormal findings of blood chemistry: Secondary | ICD-10-CM

## 2020-11-09 DIAGNOSIS — N289 Disorder of kidney and ureter, unspecified: Secondary | ICD-10-CM | POA: Insufficient documentation

## 2020-11-09 DIAGNOSIS — I129 Hypertensive chronic kidney disease with stage 1 through stage 4 chronic kidney disease, or unspecified chronic kidney disease: Secondary | ICD-10-CM

## 2020-11-19 ENCOUNTER — Other Ambulatory Visit: Payer: Self-pay

## 2020-11-19 ENCOUNTER — Ambulatory Visit
Admission: RE | Admit: 2020-11-19 | Discharge: 2020-11-19 | Disposition: A | Discharge status: Home / Self Care | Payer: Medicare Other | Source: Ambulatory Visit | Attending: Internal Medicine | Admitting: Internal Medicine

## 2020-11-19 DIAGNOSIS — Z1231 Encounter for screening mammogram for malignant neoplasm of breast: Secondary | ICD-10-CM

## 2020-11-19 DIAGNOSIS — I129 Hypertensive chronic kidney disease with stage 1 through stage 4 chronic kidney disease, or unspecified chronic kidney disease: Secondary | ICD-10-CM

## 2020-11-19 DIAGNOSIS — R7989 Other specified abnormal findings of blood chemistry: Secondary | ICD-10-CM

## 2020-11-19 DIAGNOSIS — N289 Disorder of kidney and ureter, unspecified: Secondary | ICD-10-CM

## 2021-01-27 ENCOUNTER — Telehealth: Payer: Self-pay | Admitting: Internal Medicine

## 2021-01-27 NOTE — Telephone Encounter (Signed)
Patient calling in  Patient was seen in office 09.13.22 & received a bill afterwards  Patient says this was suppose to be scheduled & coded as a yearly physical bc that is what she received but when she got the bill & called they advised her that it was coded incorrectly  Please review & follow up w/ patient [phone]

## 2021-03-04 ENCOUNTER — Telehealth: Payer: Self-pay | Admitting: Internal Medicine

## 2021-03-04 MED ORDER — COVID-19 AT HOME ANTIGEN TEST VI KIT: PACK | 1 refills | Status: DC

## 2021-03-04 NOTE — Telephone Encounter (Signed)
Rep w/ [employer] requesting rx for 4 free covid test on behalf of patient sent to:    [id number] - [place], [place] - [address] AT [facility]   Rep states a rx has to be sent in order for the patient to receive the test at no cost due to change in insurance guidelines

## 2021-03-04 NOTE — Telephone Encounter (Signed)
Ok to send to [facility]

## 2021-04-28 ENCOUNTER — Telehealth: Payer: Self-pay | Admitting: Internal Medicine

## 2021-04-28 NOTE — Telephone Encounter (Signed)
Pt states her insurance is requiring a pa on nebivolol (BYSTOLIC) 5 MG tablet ? ?Pt states if pa is denied the medications covered are: atenolol, metoprolol, and propranolol ? ?Phone [phone] ? ?

## 2021-04-29 MED ORDER — BISOPROLOL FUMARATE 5 MG PO TABS: 5.0000 mg | ORAL_TABLET | Freq: Every day | ORAL | 3 refills | Status: DC

## 2021-04-29 NOTE — Telephone Encounter (Signed)
I think we were planning to switch her to a bisoprolol in place of Bystolic in the past.  Thanks ?

## 2021-05-06 ENCOUNTER — Encounter: Payer: Self-pay | Admitting: Internal Medicine

## 2021-05-06 ENCOUNTER — Ambulatory Visit (INDEPENDENT_AMBULATORY_CARE_PROVIDER_SITE_OTHER): Payer: Medicare Other | Admitting: Internal Medicine

## 2021-05-06 ENCOUNTER — Other Ambulatory Visit: Payer: Self-pay

## 2021-05-06 DIAGNOSIS — I10 Essential (primary) hypertension: Secondary | ICD-10-CM

## 2021-05-06 DIAGNOSIS — I2510 Atherosclerotic heart disease of native coronary artery without angina pectoris: Secondary | ICD-10-CM | POA: Diagnosis not present

## 2021-05-06 DIAGNOSIS — I2583 Coronary atherosclerosis due to lipid rich plaque: Secondary | ICD-10-CM

## 2021-05-06 DIAGNOSIS — E785 Hyperlipidemia, unspecified: Secondary | ICD-10-CM

## 2021-05-06 DIAGNOSIS — N289 Disorder of kidney and ureter, unspecified: Secondary | ICD-10-CM | POA: Diagnosis not present

## 2021-05-06 LAB — COMPREHENSIVE METABOLIC PANEL
ALT: 17 U/L (ref 0–35)
AST: 23 U/L (ref 0–37)
Albumin: 4.1 g/dL (ref 3.5–5.2)
Alkaline Phosphatase: 67 U/L (ref 39–117)
BUN: 26 mg/dL — ABNORMAL HIGH (ref 6–23)
CO2: 26 mEq/L (ref 19–32)
Calcium: 9.7 mg/dL (ref 8.4–10.5)
Chloride: 105 mEq/L (ref 96–112)
Creatinine, Ser: 1.23 mg/dL — ABNORMAL HIGH (ref 0.40–1.20)
GFR: 45.11 mL/min — ABNORMAL LOW (ref >60.00)
Glucose, Bld: 82 mg/dL (ref 70–99)
Potassium: 4 mEq/L (ref 3.5–5.1)
Sodium: 138 mEq/L (ref 135–145)
Total Bilirubin: 0.4 mg/dL (ref 0.2–1.2)
Total Protein: 6.9 g/dL (ref 6.0–8.3)

## 2021-05-06 NOTE — Assessment & Plan Note (Signed)
Bystolic - d/c $$$ ?Cont on Bisoprolol, Amlodipine ?

## 2021-05-06 NOTE — Progress Notes (Signed)
? ?Subjective:  ?Patient ID: Julia [name], female    DOB: [birth year]  Age: 71 y.o. MRN: [id number] ? ?CC: Follow-up and Headache ? ? ?HPI ?Grace Bushy Prudhomme presents for HTN, dyslipidemia, CAD f/u ? ?Outpatient Medications Prior to Visit  ?Medication Sig Dispense Refill  ? amLODipine (NORVASC) 10 MG tablet TAKE 1 TABLET BY MOUTH ONCE DAILY . Overdue for Annual appt must see provider for future refills 90 tablet 3  ? ASPIRIN 81 PO Take by mouth.    ? BIOTIN PO Take by mouth.    ? bisoprolol (ZEBETA) 5 MG tablet Take 1 tablet (5 mg total) by mouth daily. 90 tablet 3  ? CALCIUM PO Take by mouth.    ? Cholecalciferol (VITAMIN D3) 50 MCG (2000 UT) capsule Take 1 capsule (2,000 Units total) by mouth daily. 100 capsule 3  ? COVID-19 At Home Antigen Test KIT Use as directed. 4 kit 1  ? Ferrous Gluconate (IRON 27 PO) Take by mouth.    ? GARLIC PO Take by mouth.    ? Multiple Vitamins-Minerals (CENTRUM SILVER PO) Take by mouth.    ? Omega-3 Fatty Acids (FISH OIL PO) Take by mouth.    ? TURMERIC PO Take by mouth.    ? VITAMIN E PO Take by mouth.    ? atorvastatin (LIPITOR) 40 MG tablet Take 1 tablet (40 mg total) by mouth daily. 90 tablet 3  ? ?No facility-administered medications prior to visit.  ? ? ?ROS: ?Review of Systems  ?Constitutional:  Negative for activity change, appetite change, chills, fatigue and unexpected weight change.  ?HENT:  Negative for congestion, mouth sores and sinus pressure.   ?Eyes:  Negative for visual disturbance.  ?Respiratory:  Negative for cough and chest tightness.   ?Gastrointestinal:  Negative for abdominal pain and nausea.  ?Genitourinary:  Negative for difficulty urinating, frequency and vaginal pain.  ?Musculoskeletal:  Negative for back pain and gait problem.  ?Skin:  Negative for pallor and rash.  ?Neurological:  Negative for dizziness, tremors, weakness, numbness and headaches.  ?Psychiatric/Behavioral:  Negative for confusion and sleep disturbance.   ? ?Objective:  ?BP (!) 142/98    Pulse 66   Temp 97.9 ?F (36.6 ?C) (Oral)   Ht _0  (1.803 m)   Wt 206 lb (93.4 kg)   SpO2 97%   BMI 28.73 kg/m?  ? ?BP Readings from Last 3 Encounters:  ?05/06/21 (!) 142/98  ?11/05/20 130/72  ?07/04/20 130/70  ? ? ?Wt Readings from Last 3 Encounters:  ?05/06/21 206 lb (93.4 kg)  ?11/05/20 208 lb 3.2 oz (94.4 kg)  ?07/04/20 210 lb (95.3 kg)  ? ? ?Physical Exam ?Constitutional:   ?   General: She is not in acute distress. ?   Appearance: She is well-developed.  ?HENT:  ?   Head: Normocephalic.  ?   Right Ear: External ear normal.  ?   Left Ear: External ear normal.  ?   Nose: Nose normal.  ?Eyes:  ?   General:     ?   Right eye: No discharge.     ?   Left eye: No discharge.  ?   Conjunctiva/sclera: Conjunctivae normal.  ?   Pupils: Pupils are equal, round, and reactive to light.  ?Neck:  ?   Thyroid: No thyromegaly.  ?   Vascular: No JVD.  ?   Trachea: No tracheal deviation.  ?Cardiovascular:  ?   Rate and Rhythm: Normal rate and regular rhythm.  ?  Heart sounds: Normal heart sounds.  ?Pulmonary:  ?   Effort: No respiratory distress.  ?   Breath sounds: No stridor. No wheezing.  ?Abdominal:  ?   General: Bowel sounds are normal. There is no distension.  ?   Palpations: Abdomen is soft. There is no mass.  ?   Tenderness: There is no abdominal tenderness. There is no guarding or rebound.  ?Musculoskeletal:     ?   General: No tenderness.  ?   Cervical back: Normal range of motion and neck supple. No rigidity.  ?Lymphadenopathy:  ?   Cervical: No cervical adenopathy.  ?Skin: ?   Findings: No erythema or rash.  ?Neurological:  ?   Cranial Nerves: No cranial nerve deficit.  ?   Motor: No abnormal muscle tone.  ?   Coordination: Coordination normal.  ?   Deep Tendon Reflexes: Reflexes normal.  ?Psychiatric:     ?   Behavior: Behavior normal.     ?   Thought Content: Thought content normal.     ?   Judgment: Judgment normal.  ? ? ?Lab Results  ?Component Value Date  ? WBC 5.8 11/05/2020  ? HGB 12.4 11/05/2020  ? HCT  38.0 11/05/2020  ? PLT 254.0 11/05/2020  ? GLUCOSE 75 11/05/2020  ? CHOL 191 11/05/2020  ? TRIG 123.0 11/05/2020  ? HDL 56.20 11/05/2020  ? LDLCALC 110 (H) 11/05/2020  ? ALT 19 11/05/2020  ? AST 26 11/05/2020  ? NA 141 11/05/2020  ? K 3.7 11/05/2020  ? CL 106 11/05/2020  ? CREATININE 1.21 (H) 11/05/2020  ? BUN 21 11/05/2020  ? CO2 26 11/05/2020  ? TSH 1.19 11/05/2020  ? ? ?US RENAL ? ?Result Date: 11/21/2020 ?CLINICAL DATA:  Decreased GFR.  Hypertension. EXAM: RENAL / URINARY TRACT ULTRASOUND COMPLETE COMPARISON:  None. FINDINGS: Right Kidney: Renal measurements: 9.1 x 4.2 x 5.8 cm = volume: 114 mL. Echogenicity within normal limits. No mass or hydronephrosis visualized. Left Kidney: Renal measurements: 9.0 x 4.7 x 4.7 cm = volume: 104 mL. Echogenicity within normal limits. No mass or hydronephrosis visualized. Bladder: The urinary bladder is contracted and not visualized. Other: None. IMPRESSION: Unremarkable renal ultrasound. Electronically Signed   By: Anner Crete M.D.   On: 11/21/2020 01:22  ? ?MM 3D SCREEN BREAST BILATERAL ? ?Result Date: 11/22/2020 ?CLINICAL DATA:  Screening. EXAM: DIGITAL SCREENING BILATERAL MAMMOGRAM WITH TOMOSYNTHESIS AND CAD TECHNIQUE: Bilateral screening digital craniocaudal and mediolateral oblique mammograms were obtained. Bilateral screening digital breast tomosynthesis was performed. The images were evaluated with computer-aided detection. COMPARISON:  None. ACR Breast Density Category b: There are scattered areas of fibroglandular density. FINDINGS: There are no findings suspicious for malignancy. IMPRESSION: No mammographic evidence of malignancy. A result letter of this screening mammogram will be mailed directly to the patient. RECOMMENDATION: Screening mammogram in one year. (Code:SM-B-01Y) BI-RADS CATEGORY  1: Negative. Electronically Signed   By: Lillia Mountain M.D.   On: 11/22/2020 15:47  ? ? ?Assessment & Plan:  ? ?Problem List Items Addressed This Visit   ? ? Coronary  atherosclerosis  ?  Cont on Lipitor ?  ?  ? Dyslipidemia  ?  Monitor lipids and liver tests. On Lipitor ?  ?  ? HTN (hypertension)  ?  Bystolic - d/c $$$ ?Cont on Bisoprolol, Amlodipine ?  ?  ? Renal insufficiency  ?  Continue to monitor GFR and to hydrate well ?Treat HTN ?  ?  ? Relevant Orders  ? Comprehensive metabolic  panel  ?  ? ? ?No orders of the defined types were placed in this encounter. ?  ? ? ?Follow-up: Return in about 6 months (around 11/06/2021) for a follow-up visit. ? ?Walker Kehr, MD ?

## 2021-05-06 NOTE — Assessment & Plan Note (Signed)
Monitor lipids and liver tests. On Lipitor ?

## 2021-05-06 NOTE — Assessment & Plan Note (Signed)
Cont on Lipitor 

## 2021-05-06 NOTE — Assessment & Plan Note (Signed)
Continue to monitor GFR and to hydrate well Treat HTN 

## 2021-06-05 ENCOUNTER — Ambulatory Visit (INDEPENDENT_AMBULATORY_CARE_PROVIDER_SITE_OTHER): Payer: Medicare Other

## 2021-06-05 DIAGNOSIS — Z0000 Encounter for general adult medical examination without abnormal findings: Secondary | ICD-10-CM

## 2021-06-05 NOTE — Progress Notes (Addendum)
?I connected with Julia [name] today by telephone and verified that I am speaking with the correct person using two identifiers. ?Location patient: home ?Location provider: work ?Persons participating in the virtual visit: patient, provider. ?  ?I discussed the limitations, risks, security and privacy concerns of performing an evaluation and management service by telephone and the availability of in person appointments. I also discussed with the patient that there may be a patient responsible charge related to this service. The patient expressed understanding and verbally consented to this telephonic visit.  ?  ?Interactive audio and video telecommunications were attempted between this provider and patient, however failed, due to patient having technical difficulties OR patient did not have access to video capability.  We continued and completed visit with audio only. ? ?Some vital signs may be absent or patient reported.  ? ?Time Spent with patient on telephone encounter: 30 minutes ? ?Subjective:  ? Julia [name] is a 72 y.o. female who presents for Medicare Annual (Subsequent) preventive examination. ? ?Review of Systems    ? ?Cardiac Risk Factors include: advanced age (>12mn, >>38women);dyslipidemia;hypertension;family history of premature cardiovascular disease ? ?   ?Objective:  ?  ?There were no vitals filed for this visit. ?There is no height or weight on file to calculate BMI. ? ? ?  06/05/2021  ?  1:04 PM  ?Advanced Directives  ?Does Patient Have a Medical Advance Directive? No  ?Would patient like information on creating a medical advance directive? No - Patient declined  ? ? ?Current Medications (verified) ?Outpatient Encounter Medications as of 06/05/2021  ?Medication Sig  ? amLODipine (NORVASC) 10 MG tablet TAKE 1 TABLET BY MOUTH ONCE DAILY . Overdue for Annual appt must see provider for future refills  ? ASPIRIN 81 PO Take by mouth.  ? atorvastatin (LIPITOR) 40 MG tablet Take 1 tablet (40 mg  total) by mouth daily.  ? BIOTIN PO Take by mouth.  ? bisoprolol (ZEBETA) 5 MG tablet Take 1 tablet (5 mg total) by mouth daily.  ? CALCIUM PO Take by mouth.  ? Cholecalciferol (VITAMIN D3) 50 MCG (2000 UT) capsule Take 1 capsule (2,000 Units total) by mouth daily.  ? COVID-19 At Home Antigen Test KIT Use as directed.  ? Ferrous Gluconate (IRON 27 PO) Take by mouth.  ? GARLIC PO Take by mouth.  ? Multiple Vitamins-Minerals (CENTRUM SILVER PO) Take by mouth.  ? Omega-3 Fatty Acids (FISH OIL PO) Take by mouth.  ? TURMERIC PO Take by mouth.  ? VITAMIN E PO Take by mouth.  ? ?No facility-administered encounter medications on file as of 06/05/2021.  ? ? ?Allergies (verified) ?Pravastatin  ? ?History: ?Past Medical History:  ?Diagnosis Date  ? Arthritis   ? GERD (gastroesophageal reflux disease)   ? Hyperlipidemia   ? Hypertension   ? ?Past Surgical History:  ?Procedure Laterality Date  ? BREAST BIOPSY    ? JOINT REPLACEMENT    ? Both knees  ? ?Family History  ?Problem Relation Age of Onset  ? Arthritis Mother   ? Heart disease Mother   ?     Died of "poor circulation"   ? Hyperlipidemia Mother   ? Hypertension Mother   ? Cancer Father   ? Diabetes Sister   ? Hypertension Sister   ? Hypertension Brother   ? Drug abuse Brother   ? Heart disease Brother   ? ?Social History  ? ?Socioeconomic History  ? Marital status: Significant Other  ?  Spouse name:  Not on file  ? Number of children: Not on file  ? Years of education: Not on file  ? Highest education level: Not on file  ?Occupational History  ? Not on file  ?Tobacco Use  ? Smoking status: Never  ? Smokeless tobacco: Never  ?Substance and Sexual Activity  ? Alcohol use: Never  ? Drug use: Never  ? Sexual activity: Yes  ?Other Topics Concern  ? Not on file  ?Social History Narrative  ? Three children.  5 grand.    ? ?Social Determinants of Health  ? ?Financial Resource Strain: Low Risk   ? Difficulty of Paying Living Expenses: Not hard at all  ?Food Insecurity: No Food  Insecurity  ? Worried About Charity fundraiser in the Last Year: Never true  ? Ran Out of Food in the Last Year: Never true  ?Transportation Needs: No Transportation Needs  ? Lack of Transportation (Medical): No  ? Lack of Transportation (Non-Medical): No  ?Physical Activity: Sufficiently Active  ? Days of Exercise per Week: 5 days  ? Minutes of Exercise per Session: 30 min  ?Stress: No Stress Concern Present  ? Feeling of Stress : Not at all  ?Social Connections: Socially Integrated  ? Frequency of Communication with Friends and Family: More than three times a week  ? Frequency of Social Gatherings with Friends and Family: More than three times a week  ? Attends Religious Services: More than 4 times per year  ? Active Member of Clubs or Organizations: Yes  ? Attends Archivist Meetings: More than 4 times per year  ? Marital Status: Living with partner  ? ? ?Tobacco Counseling ?Counseling given: Not Answered ? ? ?Clinical Intake: ? ?Pre-visit preparation completed: Yes ? ?Pain : No/denies pain ? ?  ? ?Nutritional Risks: None ?Diabetes: No ? ?How often do you need to have someone help you when you read instructions, pamphlets, or other written materials from your doctor or pharmacy?: 1 - Never ?What is the last grade level you completed in school?: HSG ? ?Diabetic? no ? ?Interpreter Needed?: No ? ?Information entered by :: Lisette Abu, LPN ? ? ?Activities of Daily Living ? ?  06/05/2021  ?  1:08 PM  ?In your present state of health, do you have any difficulty performing the following activities:  ?Hearing? 0  ?Vision? 0  ?Difficulty concentrating or making decisions? 0  ?Walking or climbing stairs? 0  ?Dressing or bathing? 0  ?Doing errands, shopping? 0  ?Preparing Food and eating ? N  ?Using the Toilet? N  ?In the past six months, have you accidently leaked urine? N  ?Do you have problems with loss of bowel control? N  ?Managing your Medications? N  ?Managing your Finances? N  ?Housekeeping or  managing your Housekeeping? N  ? ? ?Patient Care Team: ?Plotnikov, Evie Lacks, MD as PCP - General (Internal Medicine) ? ?Indicate any recent Medical Services you may have received from other than [facility] providers in the past year (date may be approximate). ? ?   ?Assessment:  ? This is a routine wellness examination for [facility]. ? ?Hearing/Vision screen ?Hearing Screening - Comments:: Patient denied any hearing difficulty.   ?No hearing aids. ? ?Vision Screening - Comments:: Patient does wear corrective lenses/contacts.  ?Eye exam done by: [facility] ? ? ? ?Dietary issues and exercise activities discussed: ?Current Exercise Habits: Home exercise routine, Time (Minutes): 30, Frequency (Times/Week): 5, Weekly Exercise (Minutes/Week): 150, Intensity: Moderate ? ? Goals  Addressed   ? ?  ?  ?  ?  ? This Visit's Progress  ?  My goal is to stay fit and get more motivated.     ? ?  ?Depression Screen ? ?  06/05/2021  ?  1:08 PM 05/06/2021  ?  1:43 PM 11/05/2020  ?  1:55 PM 09/08/2018  ?  9:22 AM  ?PHQ 2/9 Scores  ?PHQ - 2 Score 0 0 0 0  ?PHQ- 9 Score   0   ?  ?Fall Risk ? ?  06/05/2021  ?  1:05 PM 05/06/2021  ?  1:43 PM 11/05/2020  ?  1:54 PM 09/08/2018  ?  9:22 AM  ?Fall Risk   ?Falls in the past year? 0 0 0 0  ?Number falls in past yr: 0  0   ?Injury with Fall? 0  0   ?Risk for fall due to : No Fall Risks  No Fall Risks   ?Follow up Falls evaluation completed   Falls evaluation completed  ? ? ?FALL RISK PREVENTION PERTAINING TO THE HOME: ? ?Any stairs in or around the home? No  ?If so, are there any without handrails? No  ?Home free of loose throw rugs in walkways, pet beds, electrical cords, etc? Yes  ?Adequate lighting in your home to reduce risk of falls? Yes  ? ?ASSISTIVE DEVICES UTILIZED TO PREVENT FALLS: ? ?Life alert? No  ?Use of a cane, walker or w/c? No  ?Grab bars in the bathroom? Yes  ?Shower chair or bench in shower? Yes  ?Elevated toilet seat or a handicapped toilet? Yes  ? ?TIMED UP AND GO: ? ?Was the test  performed? No .  ?Length of time to ambulate 10 feet: n/a sec.  ? ?Appearance of gait: Patient not evaluated for gait during this visit. ? ?Cognitive Function: ?  ?  ? ?  06/05/2021  ?  1:17 PM  ?6CIT Screen  ?What Year? 0 p

## 2021-06-05 NOTE — Patient Instructions (Signed)
[name] , ?Thank you for taking time to come for your Medicare Wellness Visit. I appreciate your ongoing commitment to your health goals. Please review the following plan we discussed and let me know if I can assist you in the future.  ? ?Screening recommendations/referrals: ?Colonoscopy: 02/23/2014; due every 10 years ?Mammogram: 11/19/2020; due every year ?Bone Density: never done in [place] ?Recommended yearly ophthalmology/optometry visit for glaucoma screening and checkup ?Recommended yearly dental visit for hygiene and checkup ? ?Vaccinations: ?Influenza vaccine: 10/22/2020 ?Pneumococcal vaccine: 11/23/2017, 9/13/20202 ?Tdap vaccine: 09/07/2017; due every 10 years ?Shingles vaccine: 11/23/2017, 02/05/2018   ?Covid-19:04/07/2019, 05/05/2019, 12/18/2019, 05/24/2020 ? ?Advanced directives: No  ? ?Conditions/risks identified: Yes ? ?Next appointment: Please schedule your next Medicare Wellness Visit with your Nurse Health Advisor in 1 year by calling [phone]. ? ? ?Preventive Care 5865 Years and Older, Female ?Preventive care refers to lifestyle choices and visits with your health care provider that can promote health and wellness. ?What does preventive care include? ?A yearly physical exam. This is also called an annual well check. ?Dental exams once or twice a year. ?Routine eye exams. Ask your health care provider how often you should have your eyes checked. ?Personal lifestyle choices, including: ?Daily care of your teeth and gums. ?Regular physical activity. ?Eating a healthy diet. ?Avoiding tobacco and drug use. ?Limiting alcohol use. ?Practicing safe sex. ?Taking low-dose aspirin every day. ?Taking vitamin and mineral supplements as recommended by your health care provider. ?What happens during an annual well check? ?The services and screenings done by your health care provider during your annual well check will depend on your age, overall health, lifestyle risk factors, and family history of disease. ?Counseling   ?Your health care provider may ask you questions about your: ?Alcohol use. ?Tobacco use. ?Drug use. ?Emotional well-being. ?Home and relationship well-being. ?Sexual activity. ?Eating habits. ?History of falls. ?Memory and ability to understand (cognition). ?Work and work Astronomerenvironment. ?Reproductive health. ?Screening  ?You may have the following tests or measurements: ?Height, weight, and BMI. ?Blood pressure. ?Lipid and cholesterol levels. These may be checked every 5 years, or more frequently if you are over 69 years old. ?Skin check. ?Lung cancer screening. You may have this screening every year starting at age 69 if you have a 30-pack-year history of smoking and currently smoke or have quit within the past 15 years. ?Fecal occult blood test (FOBT) of the stool. You may have this test every year starting at age 69. ?Flexible sigmoidoscopy or colonoscopy. You may have a sigmoidoscopy every 5 years or a colonoscopy every 10 years starting at age 69. ?Hepatitis C blood test. ?Hepatitis B blood test. ?Sexually transmitted disease (STD) testing. ?Diabetes screening. This is done by checking your blood sugar (glucose) after you have not eaten for a while (fasting). You may have this done every 1-3 years. ?Bone density scan. This is done to screen for osteoporosis. You may have this done starting at age 69. ?Mammogram. This may be done every 1-2 years. Talk to your health care provider about how often you should have regular mammograms. ?Talk with your health care provider about your test results, treatment options, and if necessary, the need for more tests. ?Vaccines  ?Your health care provider may recommend certain vaccines, such as: ?Influenza vaccine. This is recommended every year. ?Tetanus, diphtheria, and acellular pertussis (Tdap, Td) vaccine. You may need a Td booster every 10 years. ?Zoster vaccine. You may need this after age 69. ?Pneumococcal 13-valent conjugate (PCV13) vaccine. One dose is  recommended  after age 65. ?Pneumococcal polysaccharide (PPSV23) vaccine. One dose is recommended after age 74. ?Talk to your health care provider about which screenings and vaccines you need and how often you need them. ?This information is not intended to replace advice given to you by your health care provider. Make sure you discuss any questions you have with your health care provider. ?Document Released: 03/08/2015 Document Revised: 10/30/2015 Document Reviewed: 12/11/2014 ?Elsevier Interactive Patient Education ? 2017 Elsevier Inc. ? ?Fall Prevention in the Home ?Falls can cause injuries. They can happen to people of all ages. There are many things you can do to make your home safe and to help prevent falls. ?What can I do on the outside of my home? ?Regularly fix the edges of walkways and driveways and fix any cracks. ?Remove anything that might make you trip as you walk through a door, such as a raised step or threshold. ?Trim any bushes or trees on the path to your home. ?Use bright outdoor lighting. ?Clear any walking paths of anything that might make someone trip, such as rocks or tools. ?Regularly check to see if handrails are loose or broken. Make sure that both sides of any steps have handrails. ?Any raised decks and porches should have guardrails on the edges. ?Have any leaves, snow, or ice cleared regularly. ?Use sand or salt on walking paths during winter. ?Clean up any spills in your garage right away. This includes oil or grease spills. ?What can I do in the bathroom? ?Use night lights. ?Install grab bars by the toilet and in the tub and shower. Do not use towel bars as grab bars. ?Use non-skid mats or decals in the tub or shower. ?If you need to sit down in the shower, use a plastic, non-slip stool. ?Keep the floor dry. Clean up any water that spills on the floor as soon as it happens. ?Remove soap buildup in the tub or shower regularly. ?Attach bath mats securely with double-sided non-slip rug tape. ?Do not  have throw rugs and other things on the floor that can make you trip. ?What can I do in the bedroom? ?Use night lights. ?Make sure that you have a light by your bed that is easy to reach. ?Do not use any sheets or blankets that are too big for your bed. They should not hang down onto the floor. ?Have a firm chair that has side arms. You can use this for support while you get dressed. ?Do not have throw rugs and other things on the floor that can make you trip. ?What can I do in the kitchen? ?Clean up any spills right away. ?Avoid walking on wet floors. ?Keep items that you use a lot in easy-to-reach places. ?If you need to reach something above you, use a strong step stool that has a grab bar. ?Keep electrical cords out of the way. ?Do not use floor polish or wax that makes floors slippery. If you must use wax, use non-skid floor wax. ?Do not have throw rugs and other things on the floor that can make you trip. ?What can I do with my stairs? ?Do not leave any items on the stairs. ?Make sure that there are handrails on both sides of the stairs and use them. Fix handrails that are broken or loose. Make sure that handrails are as long as the stairways. ?Check any carpeting to make sure that it is firmly attached to the stairs. Fix any carpet that is loose or worn. ?Avoid having  throw rugs at the top or bottom of the stairs. If you do have throw rugs, attach them to the floor with carpet tape. ?Make sure that you have a light switch at the top of the stairs and the bottom of the stairs. If you do not have them, ask someone to add them for you. ?What else can I do to help prevent falls? ?Wear shoes that: ?Do not have high heels. ?Have rubber bottoms. ?Are comfortable and fit you well. ?Are closed at the toe. Do not wear sandals. ?If you use a stepladder: ?Make sure that it is fully opened. Do not climb a closed stepladder. ?Make sure that both sides of the stepladder are locked into place. ?Ask someone to hold it for  you, if possible. ?Clearly mark and make sure that you can see: ?Any grab bars or handrails. ?First and last steps. ?Where the edge of each step is. ?Use tools that help you move around (mobility aids) if the

## 2021-06-19 ENCOUNTER — Telehealth: Payer: Self-pay | Admitting: Cardiology

## 2021-06-19 NOTE — Telephone Encounter (Signed)
Patient's husband calling concerned for the patient. He states she is wearing a blanket, but it is not cold. He repeated multiple times that she is "playing games with him" by "getting up and then not getting up". He says she is in "total denial". He states he knows what afib is and the symptoms. He states he is on xeralto. He then repeated again that the patient "plays games with him relative to getting up and not getting up". He states she will probably take the blanket off. He wants to expedite the process of her being seen. He states they both have an account. He says when he "pushes the button it goes funky".  ? ?  ?

## 2021-06-20 NOTE — Telephone Encounter (Signed)
Left Message to call back  Julia [name]. ?

## 2021-06-24 NOTE — Telephone Encounter (Signed)
Left message for pt to call.

## 2021-08-07 NOTE — Progress Notes (Signed)
Cardiology Office Note   Date:  08/08/2021   ID:  Julia [name], DOB 29-Jan-1951, MRN [id number]  PCP:  Cassandria Anger, MD  Cardiologist:   None Referring:  Plotnikov, Evie Lacks, MD  Chief Complaint  Patient presents with   Palpitations      History of Present Illness: Julia [name] is a 71 y.o. female who is referred by Plotnikov, Evie Lacks, MD for evaluation of palpitations.   She has a history of paroxysmal atrial fibrillation with rapid ventricular rate.  She had a perfusion study from 2019 with an EF of 66% and no evidence of ischemia.  I do see a Holter report with some evidence of tachycardia suggestive of atrial tachycardia and also short runs in therapy.  She had a CT angiography of her coronaries with no significant disease.  She did have a calcium score of 38.2.  Since I last saw her she has had no new cardiovascular problems.  She walks 5 days a week and goes to the gym 5 days a week.  She does have occasional tachypalpitations that might last several minutes.  She just comes down and they go away.  She has not had any sustained tachyarrhythmias.  She denies any chest pressure, neck or arm discomfort.  Her husband who is also my patient died of colon cancer.   Past Medical History:  Diagnosis Date   Arthritis    GERD (gastroesophageal reflux disease)    Hyperlipidemia    Hypertension     Past Surgical History:  Procedure Laterality Date   BREAST BIOPSY     JOINT REPLACEMENT     Both knees     Current Outpatient Medications  Medication Sig Dispense Refill   amLODipine (NORVASC) 10 MG tablet TAKE 1 TABLET BY MOUTH ONCE DAILY . Overdue for Annual appt must see provider for future refills 90 tablet 3   ASPIRIN 81 PO Take 81 mg by mouth daily in the afternoon.     atorvastatin (LIPITOR) 40 MG tablet Take 1 tablet (40 mg total) by mouth daily. 90 tablet 3   BIOTIN PO Take 1 capsule by mouth daily in the afternoon.     bisoprolol (ZEBETA) 5 MG tablet  Take 1 tablet (5 mg total) by mouth daily. 90 tablet 3   CALCIUM PO Take 1 capsule by mouth daily in the afternoon.     Cholecalciferol (VITAMIN D3) 50 MCG (2000 UT) capsule Take 1 capsule (2,000 Units total) by mouth daily. 100 capsule 3   Ferrous Gluconate (IRON 27 PO) Take by mouth.     GARLIC PO Take 1 capsule by mouth daily in the afternoon.     Multiple Vitamins-Minerals (CENTRUM SILVER PO) Take 1 tablet by mouth daily in the afternoon.     Omega-3 Fatty Acids (FISH OIL PO) Take 1 capsule by mouth daily in the afternoon.     TURMERIC PO Take 1 capsule by mouth daily in the afternoon.     VITAMIN E PO Take 1 capsule by mouth daily in the afternoon.     COVID-19 At Home Antigen Test KIT Use as directed. (Patient not taking: Reported on 08/08/2021) 4 kit 1   No current facility-administered medications for this visit.    Allergies:   Pravastatin    ROS:  Please see the history of present illness.   Otherwise, review of systems are positive for none.   All other systems are reviewed and negative.    PHYSICAL EXAM:  VS:  BP 116/60 (BP Location: Left Arm, Patient Position: Sitting, Cuff Size: Large)   Pulse 62   Ht '5\' 8"'  (1.727 m)   Wt 196 lb (88.9 kg)   SpO2 97%   BMI 29.80 kg/m  , BMI Body mass index is 29.8 kg/m. GENERAL:  Well appearing NECK:  No jugular venous distention, waveform within normal limits, carotid upstroke brisk and symmetric, no bruits, no thyromegaly LUNGS:  Clear to auscultation bilaterally CHEST:  Unremarkable HEART:  PMI not displaced or sustained,S1 and S2 within normal limits, no S3, no S4, no clicks, no rubs, no murmurs ABD:  Flat, positive bowel sounds normal in frequency in pitch, no bruits, no rebound, no guarding, no midline pulsatile mass, no hepatomegaly, no splenomegaly EXT:  2 plus pulses throughout, no edema, no cyanosis no clubbing   Recent Labs: 11/05/2020: Hemoglobin 12.4; Platelets 254.0; TSH 1.19 05/06/2021: ALT 17; BUN 26; Creatinine, Ser  1.23; Potassium 4.0; Sodium 138    Lipid Panel    Component Value Date/Time   CHOL 191 11/05/2020 1459   CHOL 215 (H) 07/04/2020 0946   TRIG 123.0 11/05/2020 1459   HDL 56.20 11/05/2020 1459   HDL 55 07/04/2020 0946   CHOLHDL 3 11/05/2020 1459   VLDL 24.6 11/05/2020 1459   LDLCALC 110 (H) 11/05/2020 1459   LDLCALC 143 (H) 07/04/2020 0946      Wt Readings from Last 3 Encounters:  08/08/21 196 lb (88.9 kg)  05/06/21 206 lb (93.4 kg)  11/05/20 208 lb 3.2 oz (94.4 kg)    EKG: Sinus rhythm, rate 62, axis within normal limits, intervals within normal limits, no acute ST-T wave changes.  Premature atrial contractions  Other studies Reviewed: Additional studies/ records that were reviewed today include: Labs Review of the above records demonstrates:  Please see elsewhere in the note.     ASSESSMENT AND PLAN:  PALPITATIONS:    The patient has limited tachypalpitations.   No change in therapy.    CORONARY CALCIUM:   We talked about primary risk reduction.  No change in therapy.  HTN: Her blood pressure is controlled.  No change in therapy.   DYSLIPIDEMIA:   LDL was 110.  HDL 56.2.  She is not taking Lipitor.  We talked about also diet control.  I suggested an LDL goal of less than 100.   Current medicines are reviewed at length with the patient today.  The patient does not have concerns regarding medicines.  The following changes have been made:  None  Labs/ tests ordered today include: None  Orders Placed This Encounter  Procedures   EKG 12-Lead     Disposition:   FU with me in one year.    Signed, Minus Breeding, MD  08/08/2021 4:08 PM    [facility]

## 2021-08-08 ENCOUNTER — Ambulatory Visit (INDEPENDENT_AMBULATORY_CARE_PROVIDER_SITE_OTHER): Payer: Medicare Other | Admitting: Cardiology

## 2021-08-08 ENCOUNTER — Encounter: Payer: Self-pay | Admitting: Cardiology

## 2021-08-08 VITALS — BP 116/60 | HR 62 | Ht 68.0 in | Wt 196.0 lb

## 2021-08-08 DIAGNOSIS — I10 Essential (primary) hypertension: Secondary | ICD-10-CM

## 2021-08-08 DIAGNOSIS — R002 Palpitations: Secondary | ICD-10-CM

## 2021-08-08 DIAGNOSIS — E785 Hyperlipidemia, unspecified: Secondary | ICD-10-CM | POA: Diagnosis not present

## 2021-08-08 DIAGNOSIS — R931 Abnormal findings on diagnostic imaging of heart and coronary circulation: Secondary | ICD-10-CM

## 2021-08-08 NOTE — Patient Instructions (Signed)
  Follow-Up: At [facility], you and your health needs are our priority.  As part of our continuing mission to provide you with exceptional heart care, we have created designated Provider Care Teams.  These Care Teams include your primary Cardiologist (physician) and Advanced Practice Providers (APPs -  Physician Assistants and Nurse Practitioners) who all work together to provide you with the care you need, when you need it.  We recommend signing up for the patient portal called "MyChart".  Sign up information is provided on this After Visit Summary.  MyChart is used to connect with patients for Virtual Visits (Telemedicine).  Patients are able to view lab/test results, encounter notes, upcoming appointments, etc.  Non-urgent messages can be sent to your provider as well.   To learn more about what you can do with MyChart, go to [email or web address].    Your next appointment:   12 month(s)  The format for your next appointment:   In Person  Provider:   James Hochrein MD      Important Information About Sugar       

## 2021-09-24 ENCOUNTER — Other Ambulatory Visit: Payer: Self-pay | Admitting: Internal Medicine

## 2021-10-13 ENCOUNTER — Other Ambulatory Visit: Payer: Self-pay | Admitting: Internal Medicine

## 2021-10-13 DIAGNOSIS — Z1231 Encounter for screening mammogram for malignant neoplasm of breast: Secondary | ICD-10-CM

## 2021-10-18 ENCOUNTER — Other Ambulatory Visit: Payer: Self-pay | Admitting: Internal Medicine

## 2021-11-03 DIAGNOSIS — E669 Obesity, unspecified: Secondary | ICD-10-CM | POA: Diagnosis not present

## 2021-11-03 DIAGNOSIS — I10 Essential (primary) hypertension: Secondary | ICD-10-CM | POA: Diagnosis not present

## 2021-11-03 DIAGNOSIS — Z8249 Family history of ischemic heart disease and other diseases of the circulatory system: Secondary | ICD-10-CM | POA: Diagnosis not present

## 2021-11-03 DIAGNOSIS — Z008 Encounter for other general examination: Secondary | ICD-10-CM | POA: Diagnosis not present

## 2021-11-03 DIAGNOSIS — Z7982 Long term (current) use of aspirin: Secondary | ICD-10-CM | POA: Diagnosis not present

## 2021-11-03 DIAGNOSIS — Z809 Family history of malignant neoplasm, unspecified: Secondary | ICD-10-CM | POA: Diagnosis not present

## 2021-11-03 DIAGNOSIS — E785 Hyperlipidemia, unspecified: Secondary | ICD-10-CM | POA: Diagnosis not present

## 2021-11-03 DIAGNOSIS — Z6830 Body mass index (BMI) 30.0-30.9, adult: Secondary | ICD-10-CM | POA: Diagnosis not present

## 2021-11-10 ENCOUNTER — Encounter: Payer: Self-pay | Admitting: Internal Medicine

## 2021-11-10 ENCOUNTER — Ambulatory Visit (INDEPENDENT_AMBULATORY_CARE_PROVIDER_SITE_OTHER): Payer: Medicare HMO | Admitting: Internal Medicine

## 2021-11-10 VITALS — BP 120/80 | HR 45 | Temp 98.3°F | Ht 68.0 in | Wt 195.4 lb

## 2021-11-10 DIAGNOSIS — I2510 Atherosclerotic heart disease of native coronary artery without angina pectoris: Secondary | ICD-10-CM | POA: Diagnosis not present

## 2021-11-10 DIAGNOSIS — M170 Bilateral primary osteoarthritis of knee: Secondary | ICD-10-CM | POA: Diagnosis not present

## 2021-11-10 DIAGNOSIS — I2583 Coronary atherosclerosis due to lipid rich plaque: Secondary | ICD-10-CM | POA: Diagnosis not present

## 2021-11-10 DIAGNOSIS — I10 Essential (primary) hypertension: Secondary | ICD-10-CM | POA: Diagnosis not present

## 2021-11-10 DIAGNOSIS — N289 Disorder of kidney and ureter, unspecified: Secondary | ICD-10-CM | POA: Diagnosis not present

## 2021-11-10 DIAGNOSIS — R202 Paresthesia of skin: Secondary | ICD-10-CM | POA: Insufficient documentation

## 2021-11-10 LAB — CBC WITH DIFFERENTIAL/PLATELET
Basophils Absolute: 0 10*3/uL (ref 0.0–0.1)
Basophils Relative: 1 % (ref 0.0–3.0)
Eosinophils Absolute: 0.1 10*3/uL (ref 0.0–0.7)
Eosinophils Relative: 3 % (ref 0.0–5.0)
HCT: 39.8 % (ref 36.0–46.0)
Hemoglobin: 13.1 g/dL (ref 12.0–15.0)
Lymphocytes Relative: 35.7 % (ref 12.0–46.0)
Lymphs Abs: 1.5 10*3/uL (ref 0.7–4.0)
MCHC: 33 g/dL (ref 30.0–36.0)
MCV: 90.9 fl (ref 78.0–100.0)
Monocytes Absolute: 0.4 10*3/uL (ref 0.1–1.0)
Monocytes Relative: 10.3 % (ref 3.0–12.0)
Neutro Abs: 2 10*3/uL (ref 1.4–7.7)
Neutrophils Relative %: 50 % (ref 43.0–77.0)
Platelets: 173 10*3/uL (ref 150.0–400.0)
RBC: 4.37 Mil/uL (ref 3.87–5.11)
RDW: 14.4 % (ref 11.5–15.5)
WBC: 4.1 10*3/uL (ref 4.0–10.5)

## 2021-11-10 LAB — COMPREHENSIVE METABOLIC PANEL
ALT: 27 U/L (ref 0–35)
AST: 33 U/L (ref 0–37)
Albumin: 4.1 g/dL (ref 3.5–5.2)
Alkaline Phosphatase: 71 U/L (ref 39–117)
BUN: 19 mg/dL (ref 6–23)
CO2: 24 mEq/L (ref 19–32)
Calcium: 9.7 mg/dL (ref 8.4–10.5)
Chloride: 108 mEq/L (ref 96–112)
Creatinine, Ser: 1.2 mg/dL (ref 0.40–1.20)
GFR: 46.3 mL/min — ABNORMAL LOW (ref >60.00)
Glucose, Bld: 77 mg/dL (ref 70–99)
Potassium: 3.7 mEq/L (ref 3.5–5.1)
Sodium: 143 mEq/L (ref 135–145)
Total Bilirubin: 0.5 mg/dL (ref 0.2–1.2)
Total Protein: 7.4 g/dL (ref 6.0–8.3)

## 2021-11-10 LAB — VITAMIN B12: Vitamin B-12: 576 pg/mL (ref 211–911)

## 2021-11-10 NOTE — Assessment & Plan Note (Signed)
Toes Blue-Emu cream - use 2-3 times a day Check B12

## 2021-11-10 NOTE — Assessment & Plan Note (Signed)
Cont on Bisoprolol, Amlodipine

## 2021-11-10 NOTE — Assessment & Plan Note (Signed)
Continue to monitor GFR and to hydrate well Treat HTN

## 2021-11-10 NOTE — Progress Notes (Signed)
Subjective:  Patient ID: Julia [name], female    DOB: 1950/06/27  Age: 71 y.o. MRN: [id number]  CC: Follow-up (6 months f/u)   HPI Julia [name] presents for 6 mo Her husband died in 04-Jul-2021 - in counseling C/o toes being numb at times   Outpatient Medications Prior to Visit  Medication Sig Dispense Refill   amLODipine (NORVASC) 10 MG tablet Take 1 tablet (10 mg total) by mouth daily. Annual appt due must see provider for future refills 30 tablet 0   ASPIRIN 81 PO Take 81 mg by mouth daily in the afternoon.     atorvastatin (LIPITOR) 40 MG tablet Take 1 tablet (40 mg total) by mouth daily. Annual appt due must see provider for future refills 30 tablet 0   BIOTIN PO Take 1 capsule by mouth daily in the afternoon.     bisoprolol (ZEBETA) 5 MG tablet Take 1 tablet (5 mg total) by mouth daily. 90 tablet 3   CALCIUM PO Take 1 capsule by mouth daily in the afternoon.     Cholecalciferol (VITAMIN D3) 50 MCG (2000 UT) capsule Take 1 capsule (2,000 Units total) by mouth daily. 100 capsule 3   Ferrous Gluconate (IRON 27 PO) Take by mouth.     GARLIC PO Take 1 capsule by mouth daily in the afternoon.     Moringa 500 MG CAPS Take 1 capsule by mouth daily.     Multiple Vitamins-Minerals (CENTRUM SILVER PO) Take 1 tablet by mouth daily in the afternoon.     Omega-3 Fatty Acids (FISH OIL PO) Take 1 capsule by mouth daily in the afternoon.     TURMERIC PO Take 1 capsule by mouth daily in the afternoon.     VITAMIN E PO Take 1 capsule by mouth daily in the afternoon.     COVID-19 At Home Antigen Test KIT Use as directed. (Patient not taking: Reported on 08/08/2021) 4 kit 1   No facility-administered medications prior to visit.    ROS: Review of Systems  Constitutional:  Negative for activity change, appetite change, chills, fatigue and unexpected weight change.  HENT:  Negative for congestion, mouth sores and sinus pressure.   Eyes:  Negative for visual disturbance.   Respiratory:  Negative for cough and chest tightness.   Gastrointestinal:  Negative for abdominal pain and nausea.  Genitourinary:  Negative for difficulty urinating, frequency and vaginal pain.  Musculoskeletal:  Negative for back pain and gait problem.  Skin:  Negative for pallor and rash.  Neurological:  Negative for dizziness, tremors, weakness, numbness and headaches.  Psychiatric/Behavioral:  Negative for confusion, sleep disturbance and suicidal ideas. The patient is not nervous/anxious.     Objective:  BP 120/80   Pulse (!) 45   Temp 98.3 F (36.8 C) (Oral)   Ht '5\' 8"'  (1.727 m)   Wt 195 lb 6.4 oz (88.6 kg)   SpO2 98%   BMI 29.71 kg/m   BP Readings from Last 3 Encounters:  11/10/21 120/80  08/08/21 116/60  05/06/21 (!) 142/98    Wt Readings from Last 3 Encounters:  11/10/21 195 lb 6.4 oz (88.6 kg)  08/08/21 196 lb (88.9 kg)  05/06/21 206 lb (93.4 kg)    Physical Exam Constitutional:      General: She is not in acute distress.    Appearance: She is well-developed. She is obese.  HENT:     Head: Normocephalic.     Right Ear: External ear normal.  Left Ear: External ear normal.     Nose: Nose normal.  Eyes:     General:        Right eye: No discharge.        Left eye: No discharge.     Conjunctiva/sclera: Conjunctivae normal.     Pupils: Pupils are equal, round, and reactive to light.  Neck:     Thyroid: No thyromegaly.     Vascular: No JVD.     Trachea: No tracheal deviation.  Cardiovascular:     Rate and Rhythm: Normal rate and regular rhythm.     Heart sounds: Normal heart sounds.  Pulmonary:     Effort: No respiratory distress.     Breath sounds: No stridor. No wheezing.  Abdominal:     General: Bowel sounds are normal. There is no distension.     Palpations: Abdomen is soft. There is no mass.     Tenderness: There is no abdominal tenderness. There is no guarding or rebound.  Musculoskeletal:        General: No tenderness.     Cervical back:  Normal range of motion and neck supple. No rigidity.  Lymphadenopathy:     Cervical: No cervical adenopathy.  Skin:    Findings: No erythema or rash.  Neurological:     Cranial Nerves: No cranial nerve deficit.     Motor: No abnormal muscle tone.     Coordination: Coordination normal.     Deep Tendon Reflexes: Reflexes normal.  Psychiatric:        Behavior: Behavior normal.        Thought Content: Thought content normal.        Judgment: Judgment normal.     Lab Results  Component Value Date   WBC 5.8 11/05/2020   HGB 12.4 11/05/2020   HCT 38.0 11/05/2020   PLT 254.0 11/05/2020   GLUCOSE 82 05/06/2021   CHOL 191 11/05/2020   TRIG 123.0 11/05/2020   HDL 56.20 11/05/2020   LDLCALC 110 (H) 11/05/2020   ALT 17 05/06/2021   AST 23 05/06/2021   NA 138 05/06/2021   K 4.0 05/06/2021   CL 105 05/06/2021   CREATININE 1.23 (H) 05/06/2021   BUN 26 (H) 05/06/2021   CO2 26 05/06/2021   TSH 1.19 11/05/2020    MM 3D SCREEN BREAST BILATERAL  Result Date: 11/22/2020 CLINICAL DATA:  Screening. EXAM: DIGITAL SCREENING BILATERAL MAMMOGRAM WITH TOMOSYNTHESIS AND CAD TECHNIQUE: Bilateral screening digital craniocaudal and mediolateral oblique mammograms were obtained. Bilateral screening digital breast tomosynthesis was performed. The images were evaluated with computer-aided detection. COMPARISON:  None. ACR Breast Density Category b: There are scattered areas of fibroglandular density. FINDINGS: There are no findings suspicious for malignancy. IMPRESSION: No mammographic evidence of malignancy. A result letter of this screening mammogram will be mailed directly to the patient. RECOMMENDATION: Screening mammogram in one year. (Code:SM-B-01Y) BI-RADS CATEGORY  1: Negative. Electronically Signed   By: Lillia Mountain M.D.   On: 11/22/2020 15:47   US RENAL  Result Date: 11/21/2020 CLINICAL DATA:  Decreased GFR.  Hypertension. EXAM: RENAL / URINARY TRACT ULTRASOUND COMPLETE COMPARISON:  None.  FINDINGS: Right Kidney: Renal measurements: 9.1 x 4.2 x 5.8 cm = volume: 114 mL. Echogenicity within normal limits. No mass or hydronephrosis visualized. Left Kidney: Renal measurements: 9.0 x 4.7 x 4.7 cm = volume: 104 mL. Echogenicity within normal limits. No mass or hydronephrosis visualized. Bladder: The urinary bladder is contracted and not visualized. Other: None. IMPRESSION: Unremarkable renal ultrasound.  Electronically Signed   By: Anner Crete M.D.   On: 11/21/2020 01:22    Assessment & Plan:   Problem List Items Addressed This Visit     Coronary atherosclerosis    Cont on Lipitor      HTN (hypertension)    Cont on Bisoprolol, Amlodipine      Renal insufficiency    Continue to monitor GFR and to hydrate well Treat HTN         No orders of the defined types were placed in this encounter.     Follow-up: No follow-ups on file.  Walker Kehr, MD

## 2021-11-10 NOTE — Patient Instructions (Signed)
Blue-Emu cream -- use 2-3 times a day ? ?

## 2021-11-10 NOTE — Assessment & Plan Note (Signed)
Cont on Lipitor 

## 2021-11-20 ENCOUNTER — Ambulatory Visit
Admission: RE | Admit: 2021-11-20 | Discharge: 2021-11-20 | Disposition: A | Discharge status: Home / Self Care | Payer: Medicare HMO | Source: Ambulatory Visit | Attending: Internal Medicine | Admitting: Internal Medicine

## 2021-11-20 DIAGNOSIS — Z1231 Encounter for screening mammogram for malignant neoplasm of breast: Secondary | ICD-10-CM | POA: Diagnosis not present

## 2022-01-12 ENCOUNTER — Telehealth: Payer: Self-pay | Admitting: Internal Medicine

## 2022-01-12 MED ORDER — AMLODIPINE BESYLATE 10 MG PO TABS: 10.0000 mg | ORAL_TABLET | Freq: Every day | ORAL | 1 refills | Status: DC

## 2022-01-12 MED ORDER — ATORVASTATIN CALCIUM 40 MG PO TABS: 40.0000 mg | ORAL_TABLET | Freq: Every day | ORAL | 1 refills | Status: DC

## 2022-01-12 NOTE — Telephone Encounter (Signed)
Notified pt rx's has been sent to [email or web address]

## 2022-01-12 NOTE — Telephone Encounter (Signed)
Caller: ( ),  Relationship: ( )  Call Back Number: [phone]  Date of Last Office Visit: 11/10/21  Date of Next Office Visit: CPE 05/11/22  Medication(s) to be Refilled:  AMLODIPINE ATORVASTATIN  Last Written: 10/20/21 (no refills)  Preferred Pharmacy: [facility] [address], [place] - [address] [place], [place] [address] Phone: [phone]  Fax: [phone]   Please call pt to advise if she needs appt since she WAS seen in September. Pt ph [phone]

## 2022-01-20 DIAGNOSIS — H5203 Hypermetropia, bilateral: Secondary | ICD-10-CM | POA: Diagnosis not present

## 2022-01-20 DIAGNOSIS — H524 Presbyopia: Secondary | ICD-10-CM | POA: Diagnosis not present

## 2022-01-20 DIAGNOSIS — H52209 Unspecified astigmatism, unspecified eye: Secondary | ICD-10-CM | POA: Diagnosis not present

## 2022-01-20 DIAGNOSIS — H2513 Age-related nuclear cataract, bilateral: Secondary | ICD-10-CM | POA: Diagnosis not present

## 2022-02-04 ENCOUNTER — Emergency Department (HOSPITAL_COMMUNITY): Payer: Medicare HMO

## 2022-02-04 ENCOUNTER — Emergency Department (HOSPITAL_COMMUNITY)
Admission: EM | Admit: 2022-02-04 | Discharge: 2022-02-04 | Disposition: A | Discharge status: Home / Self Care | Payer: Medicare HMO | Attending: Emergency Medicine | Admitting: Emergency Medicine

## 2022-02-04 ENCOUNTER — Encounter (HOSPITAL_COMMUNITY): Payer: Self-pay

## 2022-02-04 ENCOUNTER — Other Ambulatory Visit: Payer: Self-pay

## 2022-02-04 DIAGNOSIS — K449 Diaphragmatic hernia without obstruction or gangrene: Secondary | ICD-10-CM | POA: Diagnosis not present

## 2022-02-04 DIAGNOSIS — I700 Atherosclerosis of aorta: Secondary | ICD-10-CM | POA: Diagnosis not present

## 2022-02-04 DIAGNOSIS — Z7982 Long term (current) use of aspirin: Secondary | ICD-10-CM | POA: Diagnosis not present

## 2022-02-04 DIAGNOSIS — R1011 Right upper quadrant pain: Secondary | ICD-10-CM | POA: Diagnosis not present

## 2022-02-04 DIAGNOSIS — R109 Unspecified abdominal pain: Secondary | ICD-10-CM

## 2022-02-04 DIAGNOSIS — I129 Hypertensive chronic kidney disease with stage 1 through stage 4 chronic kidney disease, or unspecified chronic kidney disease: Secondary | ICD-10-CM | POA: Diagnosis not present

## 2022-02-04 DIAGNOSIS — R748 Abnormal levels of other serum enzymes: Secondary | ICD-10-CM | POA: Insufficient documentation

## 2022-02-04 DIAGNOSIS — N183 Chronic kidney disease, stage 3 unspecified: Secondary | ICD-10-CM | POA: Insufficient documentation

## 2022-02-04 DIAGNOSIS — Z79899 Other long term (current) drug therapy: Secondary | ICD-10-CM | POA: Insufficient documentation

## 2022-02-04 LAB — HEPATIC FUNCTION PANEL
ALT: 253 U/L — ABNORMAL HIGH (ref 0–44)
AST: 260 U/L — ABNORMAL HIGH (ref 15–41)
Albumin: 3.5 g/dL (ref 3.5–5.0)
Alkaline Phosphatase: 100 U/L (ref 38–126)
Bilirubin, Direct: <0.1 mg/dL (ref 0.0–0.2)
Total Bilirubin: 0.6 mg/dL (ref 0.3–1.2)
Total Protein: 7.2 g/dL (ref 6.5–8.1)

## 2022-02-04 LAB — BASIC METABOLIC PANEL
Anion gap: 8 (ref 5–15)
BUN: 15 mg/dL (ref 8–23)
CO2: 22 mmol/L (ref 22–32)
Calcium: 9 mg/dL (ref 8.9–10.3)
Chloride: 106 mmol/L (ref 98–111)
Creatinine, Ser: 1.23 mg/dL — ABNORMAL HIGH (ref 0.44–1.00)
GFR, Estimated: 48 mL/min — ABNORMAL LOW (ref >60)
Glucose, Bld: 115 mg/dL — ABNORMAL HIGH (ref 70–99)
Potassium: 4 mmol/L (ref 3.5–5.1)
Sodium: 136 mmol/L (ref 135–145)

## 2022-02-04 LAB — CBC
HCT: 37.6 % (ref 36.0–46.0)
Hemoglobin: 12.5 g/dL (ref 12.0–15.0)
MCH: 30.9 pg (ref 26.0–34.0)
MCHC: 33.2 g/dL (ref 30.0–36.0)
MCV: 92.8 fL (ref 80.0–100.0)
Platelets: 219 10*3/uL (ref 150–400)
RBC: 4.05 MIL/uL (ref 3.87–5.11)
RDW: 13.2 % (ref 11.5–15.5)
WBC: 4.2 10*3/uL (ref 4.0–10.5)
nRBC: 0 % (ref 0.0–0.2)

## 2022-02-04 LAB — URINALYSIS, ROUTINE W REFLEX MICROSCOPIC
Bilirubin Urine: NEGATIVE
Glucose, UA: NEGATIVE mg/dL
Hgb urine dipstick: NEGATIVE
Ketones, ur: NEGATIVE mg/dL
Nitrite: NEGATIVE
Protein, ur: NEGATIVE mg/dL
Specific Gravity, Urine: 1.004 — ABNORMAL LOW (ref 1.005–1.030)
pH: 6 (ref 5.0–8.0)

## 2022-02-04 LAB — LIPASE, BLOOD: Lipase: 50 U/L (ref 11–51)

## 2022-02-04 MED ORDER — KETOROLAC TROMETHAMINE 15 MG/ML IJ SOLN
15.0000 mg | Freq: Once | INTRAMUSCULAR | Status: AC
  Administered 2022-02-04: 15 mg via INTRAVENOUS
  Filled 2022-02-04: qty 1

## 2022-02-04 MED ORDER — OMEPRAZOLE 20 MG PO CPDR: 20.0000 mg | DELAYED_RELEASE_CAPSULE | Freq: Every day | ORAL | 0 refills | Status: DC

## 2022-02-04 MED ORDER — METHOCARBAMOL 500 MG PO TABS: 500.0000 mg | ORAL_TABLET | Freq: Every evening | ORAL | 0 refills | Status: DC | PRN

## 2022-02-04 MED ORDER — IOHEXOL 300 MG/ML  SOLN
100.0000 mL | Freq: Once | INTRAMUSCULAR | Status: AC | PRN
  Administered 2022-02-04: 100 mL via INTRAVENOUS

## 2022-02-04 NOTE — ED Notes (Signed)
Patient verbalized understanding of discharge instructions and reasons to return to the ED 

## 2022-02-04 NOTE — ED Notes (Signed)
Attempt to establish an IV x 2 without success.  Requested another RN to attempt

## 2022-02-04 NOTE — ED Triage Notes (Signed)
Right side flank pain for the past week, denies any n/v.

## 2022-02-04 NOTE — ED Provider Triage Note (Signed)
Emergency Medicine Provider Triage Evaluation Note  Ceola Para , a 71 y.o. female  was evaluated in triage.  Pt complains of right-sided flank pain for the last 1 week.  Patient reports symptoms, every day, wax and wane.  Patient denies urinary symptoms, denies dysuria, inability to urinate, vaginal discharge.  Patient states she is taken Aleve, used heating pads which will slightly alleviate symptoms.  Patient denies any nausea, vomiting, fevers, chest pain.  Patient states minimal worsened pain.  Patient denies any heavy lifting, exertion prior to symptom onset.  Review of Systems  Positive:  Negative:   Physical Exam  BP (!) 143/73 (BP Location: Left Arm)   Pulse 60   Resp 18   SpO2 100%  Gen:   Awake, no distress   Resp:  Normal effort  MSK:   Moves extremities without difficulty  Other:    Medical Decision Making  Medically screening exam initiated at 6:06 PM.  Appropriate orders placed.  Shaunessy Prudhomme-Robinson was informed that the remainder of the evaluation will be completed by another provider, this initial triage assessment does not replace that evaluation, and the importance of remaining in the ED until their evaluation is complete.     Al Decant, PA-C 02/04/22 1806

## 2022-02-04 NOTE — ED Provider Notes (Signed)
[facility] DEPT Provider Note   CSN: [id number] Arrival date & time: 02/04/22  1744     History  Chief Complaint  Patient presents with   Flank Pain    Julia [name] is a 71 y.o. female.  Patient with his 3 of chronic kidney disease, hypertension, high cholesterol, history of hysterectomy presents to the emergency department today for evaluation of right-sided flank pain.  Patient states that the pain started about 2 weeks ago.  She denies any associated chest pain or shortness of breath.  It does move around her right side.  It has not changed her appetite or is associated with nausea, vomiting, diarrhea or constipation.  She denies dysuria, hematuria, increased frequency or urgency.  Sometimes when she is not moving, the pain is improved.  Sometimes when she gets up and walks around the pain is worse.  Initially she was taking Aleve with improvement, however this is no longer helping.       Home Medications Prior to Admission medications   Medication Sig Start Date End Date Taking? Authorizing Provider  amLODipine (NORVASC) 10 MG tablet Take 1 tablet (10 mg total) by mouth daily. 01/12/22   Plotnikov, Evie Lacks, MD  ASPIRIN 81 PO Take 81 mg by mouth daily in the afternoon.    [provider]  atorvastatin (LIPITOR) 40 MG tablet Take 1 tablet (40 mg total) by mouth daily. 01/12/22   Plotnikov, Evie Lacks, MD  BIOTIN PO Take 1 capsule by mouth daily in the afternoon.    [provider]  bisoprolol (ZEBETA) 5 MG tablet Take 1 tablet (5 mg total) by mouth daily. 04/29/21   Plotnikov, Evie Lacks, MD  CALCIUM PO Take 1 capsule by mouth daily in the afternoon.    [provider]  Cholecalciferol (VITAMIN D3) 50 MCG (2000 UT) capsule Take 1 capsule (2,000 Units total) by mouth daily. 03/13/19   Plotnikov, Evie Lacks, MD  Ferrous Gluconate (IRON 27 PO) Take by mouth.    [provider]  GARLIC PO Take 1 capsule by  mouth daily in the afternoon.    [provider]  Moringa 500 MG CAPS Take 1 capsule by mouth daily.    [provider]  Multiple Vitamins-Minerals (CENTRUM SILVER PO) Take 1 tablet by mouth daily in the afternoon.    [provider]  Omega-3 Fatty Acids (FISH OIL PO) Take 1 capsule by mouth daily in the afternoon.    [provider]  TURMERIC PO Take 1 capsule by mouth daily in the afternoon.    [provider]  VITAMIN E PO Take 1 capsule by mouth daily in the afternoon.    [provider]      Allergies    Pravastatin    Review of Systems   Review of Systems  Physical Exam Updated Vital Signs BP (!) 143/73 (BP Location: Left Arm)   Pulse 60   Temp 98.4 F (36.9 C) (Oral)   Resp 18   SpO2 100%   Physical Exam Vitals and nursing note reviewed.  Constitutional:      General: She is not in acute distress.    Appearance: She is well-developed.  HENT:     Head: Normocephalic and atraumatic.     Right Ear: External ear normal.     Left Ear: External ear normal.     Nose: Nose normal.  Eyes:     Conjunctiva/sclera: Conjunctivae normal.  Cardiovascular:     Rate and  Rhythm: Normal rate and regular rhythm.     Heart sounds: No murmur heard. Pulmonary:     Effort: No respiratory distress.     Breath sounds: No wheezing, rhonchi or rales.  Abdominal:     Palpations: Abdomen is soft.     Tenderness: There is abdominal tenderness. There is no guarding or rebound.     Comments: Patient tender to palpation over the right flank, lateral mid abdomen, and posterior costal margin.   Musculoskeletal:     Cervical back: Normal range of motion and neck supple.     Right lower leg: No edema.     Left lower leg: No edema.  Skin:    General: Skin is warm and dry.     Findings: No rash.  Neurological:     General: No focal deficit present.     Mental Status: She is alert. Mental status is at baseline.     Motor: No weakness.   Psychiatric:        Mood and Affect: Mood normal.     ED Results / Procedures / Treatments   Labs (all labs ordered are listed, but only abnormal results are displayed) Labs Reviewed  BASIC METABOLIC PANEL - Abnormal; Notable for the following components:      Result Value   Glucose, Bld 115 (*)    Creatinine, Ser 1.23 (*)    GFR, Estimated 48 (*)    All other components within normal limits  URINALYSIS, ROUTINE W REFLEX MICROSCOPIC - Abnormal; Notable for the following components:   Color, Urine STRAW (*)    Specific Gravity, Urine 1.004 (*)    Leukocytes,Ua SMALL (*)    Bacteria, UA RARE (*)    All other components within normal limits  HEPATIC FUNCTION PANEL - Abnormal; Notable for the following components:   AST 260 (*)    ALT 253 (*)    All other components within normal limits  CBC  LIPASE, BLOOD  HEPATITIS PANEL, ACUTE    EKG None  Radiology CT ABDOMEN PELVIS W CONTRAST  Result Date: 02/04/2022 CLINICAL DATA:  Right-sided flank pain for 1 week, initial encounter EXAM: CT ABDOMEN AND PELVIS WITH CONTRAST TECHNIQUE: Multidetector CT imaging of the abdomen and pelvis was performed using the standard protocol following bolus administration of intravenous contrast. RADIATION DOSE REDUCTION: This exam was performed according to the departmental dose-optimization program which includes automated exposure control, adjustment of the mA and/or kV according to patient size and/or use of iterative reconstruction technique. CONTRAST:  160mL OMNIPAQUE IOHEXOL 300 MG/ML  SOLN COMPARISON:  None Available. FINDINGS: Lower chest: No acute abnormality. Hepatobiliary: No focal liver abnormality is seen. No gallstones, gallbladder wall thickening, or biliary dilatation. Pancreas: Unremarkable. No pancreatic ductal dilatation or surrounding inflammatory changes. Spleen: Normal in size without focal abnormality. Adrenals/Urinary Tract: Adrenal glands are within normal limits. Kidneys  demonstrate a normal enhancement pattern bilaterally. No renal calculi or obstructive changes are seen. The bladder is well distended. Stomach/Bowel: No obstructive or inflammatory changes of the colon are noted. The appendix is within normal limits. Small bowel and stomach are unremarkable with the exception of a moderate to large sliding-type hiatal hernia. Vascular/Lymphatic: Aortic atherosclerosis. No enlarged abdominal or pelvic lymph nodes. Reproductive: Status post hysterectomy. No adnexal masses. Other: No abdominal wall hernia or abnormality. No abdominopelvic ascites. Musculoskeletal: Degenerative changes of lumbar spine are noted. Mild anterolisthesis of L3 on L4 and L4 on L5 is noted. IMPRESSION: Moderate to large hiatal hernia. No acute abnormality  is noted. Electronically Signed   By: Alcide Clever M.D.   On: 02/04/2022 21:19    Procedures Procedures    Medications Ordered in ED Medications - No data to display  ED Course/ Medical Decision Making/ A&P    Patient seen and examined. History obtained directly from patient. Work-up including labs, imaging, EKG ordered in triage, if performed, were reviewed.    Labs/EKG: Independently reviewed and interpreted.  This included: CBC unremarkable; BMP with baseline creatinine at 1.23, GFR 48, glucose 115, normal electrolytes; UA unremarkable.  Added hepatic function panel.  Imaging: CT abdomen pelvis ordered.  Medications/Fluids: None ordered  Most recent vital signs reviewed and are as follows: BP (!) 145/69   Pulse (!) 52   Temp 98.4 F (36.9 C) (Oral)   Resp 16   SpO2 99%   Initial impression: Right-sided flank pain  10:00 PM Reassessment performed. Patient appears stable.  Labs personally reviewed and interpreted including: Hepatic function panel with elevated transaminases, normal bilirubin.  Imaging personally visualized and interpreted including: CT abdomen pelvis, agree negative.  Right upper quadrant ultrasound  ordered.  Reviewed pertinent lab work and imaging with patient at bedside. Questions answered.   Plan: Right upper quadrant ultrasound, pending results.  If reassuring, patient counseled to avoid alcohol which she does not drink and Tylenol which she does not take.  Encourage PCP follow-up and also given referral for gastroenterology.  10:11 PM Signout to [employer] at shift change.                            Medical Decision Making Amount and/or Complexity of Data Reviewed Labs: ordered. Radiology: ordered.  Risk Prescription drug management.   For this patient's complaint of abdominal pain, the following conditions were considered on the differential diagnosis: gastritis/PUD, enteritis/duodenitis, appendicitis, cholelithiasis/cholecystitis, cholangitis, pancreatitis, ruptured viscus, colitis, diverticulitis, small/large bowel obstruction, proctitis, cystitis, pyelonephritis, ureteral colic, aortic dissection, aortic aneurysm. In women, ectopic pregnancy, pelvic inflammatory disease, ovarian cysts, and tubo-ovarian abscess were also considered. Atypical chest etiologies were also considered including ACS, PE, and pneumonia.          Final Clinical Impression(s) / ED Diagnoses Final diagnoses:  Right flank pain  Hiatal hernia  Elevated liver enzymes    Rx / DC Orders ED Discharge Orders          Ordered    omeprazole (PRILOSEC) 20 MG capsule  Daily        02/04/22 2151    methocarbamol (ROBAXIN) 500 MG tablet  At bedtime PRN        02/04/22 2151              Renne Crigler, PA-C 02/04/22 2211    Glynn Octave, MD 02/04/22 [phone]

## 2022-02-04 NOTE — Discharge Instructions (Addendum)
Please read and follow all provided instructions.  Your diagnoses today include:  1. Right flank pain   2. Hiatal hernia     Tests performed today include: Blood cell counts and platelets Kidney and liver function tests: your liver enzymes were high Urine test to look for infection Ct scan of the abdomen and pelvis: Did not show any problems but also did not show an exact cause for your pain today, you do have a hiatal hernia Vital signs. See below for your results today.   Medications prescribed:  Omeprazole (Prilosec) - stomach acid reducer  This medication can be found over-the-counter  Robaxin (methocarbamol) - muscle relaxer medication  DO NOT drive or perform any activities that require you to be awake and alert because this medicine can make you drowsy.   Take any prescribed medications only as directed.  Home care instructions:  Follow any educational materials contained in this packet.  Follow-up instructions: Please follow-up with your primary care provider in the next 3 days for further evaluation of your symptoms.    Return instructions:  SEEK IMMEDIATE MEDICAL ATTENTION IF: The pain does not go away or becomes severe  A temperature above 101F develops  Repeated vomiting occurs (multiple episodes)  The pain becomes localized to portions of the abdomen. The right side could possibly be appendicitis. In an adult, the left lower portion of the abdomen could be colitis or diverticulitis.  Blood is being passed in stools or vomit (bright red or black tarry stools)  You develop chest pain, difficulty breathing, dizziness or fainting, or become confused, poorly responsive, or inconsolable (young children) If you have any other emergent concerns regarding your health  Additional Information: Abdominal (belly) pain can be caused by many things. Your caregiver performed an examination and possibly ordered blood/urine tests and imaging (CT scan, x-rays, ultrasound). Many  cases can be observed and treated at home after initial evaluation in the emergency department. Even though you are being discharged home, abdominal pain can be unpredictable. Therefore, you need a repeated exam if your pain does not resolve, returns, or worsens. Most patients with abdominal pain don't have to be admitted to the hospital or have surgery, but serious problems like appendicitis and gallbladder attacks can start out as nonspecific pain. Many abdominal conditions cannot be diagnosed in one visit, so follow-up evaluations are very important.  Your vital signs today were: BP (!) 145/69   Pulse (!) 52   Temp 98.4 F (36.9 C) (Oral)   Resp 16   SpO2 99%  If your blood pressure (bp) was elevated above 135/85 this visit, please have this repeated by your doctor within one month. --------------

## 2022-02-04 NOTE — ED Provider Notes (Addendum)
Assumed care at shift change.  See prior note for full H&P.  Briefly, 71 y.o. F here with 2 weeks of abdominal pain.  Found to have some elevated LFT's today.  No hx of APAP or EtOH use per prior provider.  CT without acute findings.   Plan:  awaiting RUQ [place].  If reassuring, can likely be discharged to FU with PCP and GI.  Results for orders placed or performed during the hospital encounter of 02/04/22  Basic metabolic panel  Result Value Ref Range   Sodium 136 135 - 145 mmol/L   Potassium 4.0 3.5 - 5.1 mmol/L   Chloride 106 98 - 111 mmol/L   CO2 22 22 - 32 mmol/L   Glucose, Bld 115 (H) 70 - 99 mg/dL   BUN 15 8 - 23 mg/dL   Creatinine, Ser 4.13 (H) 0.44 - 1.00 mg/dL   Calcium 9.0 8.9 - 24.4 mg/dL   GFR, Estimated 48 (L) >60 mL/min   Anion gap 8 5 - 15  CBC  Result Value Ref Range   WBC 4.2 4.0 - 10.5 K/uL   RBC 4.05 3.87 - 5.11 MIL/uL   Hemoglobin 12.5 12.0 - 15.0 g/dL   HCT 01.0 27.2 - 53.6 %   MCV 92.8 80.0 - 100.0 fL   MCH 30.9 26.0 - 34.0 pg   MCHC 33.2 30.0 - 36.0 g/dL   RDW 64.4 03.4 - 74.2 %   Platelets 219 150 - 400 K/uL   nRBC 0.0 0.0 - 0.2 %  Urinalysis, Routine w reflex microscopic Urine, Clean Catch  Result Value Ref Range   Color, Urine STRAW (A) YELLOW   APPearance CLEAR CLEAR   Specific Gravity, Urine 1.004 (L) 1.005 - 1.030   pH 6.0 5.0 - 8.0   Glucose, UA NEGATIVE NEGATIVE mg/dL   Hgb urine dipstick NEGATIVE NEGATIVE   Bilirubin Urine NEGATIVE NEGATIVE   Ketones, ur NEGATIVE NEGATIVE mg/dL   Protein, ur NEGATIVE NEGATIVE mg/dL   Nitrite NEGATIVE NEGATIVE   Leukocytes,Ua SMALL (A) NEGATIVE   RBC / HPF 0-5 0 - 5 RBC/hpf   WBC, UA 0-5 0 - 5 WBC/hpf   Bacteria, UA RARE (A) NONE SEEN   Squamous Epithelial / LPF 0-5 0 - 5   Mucus PRESENT   Hepatic function panel  Result Value Ref Range   Total Protein 7.2 6.5 - 8.1 g/dL   Albumin 3.5 3.5 - 5.0 g/dL   AST 595 (H) 15 - 41 U/L   ALT 253 (H) 0 - 44 U/L   Alkaline Phosphatase 100 38 - 126 U/L   Total  Bilirubin 0.6 0.3 - 1.2 mg/dL   Bilirubin, Direct <6.3 0.0 - 0.2 mg/dL   Indirect Bilirubin NOT CALCULATED 0.3 - 0.9 mg/dL  Lipase, blood  Result Value Ref Range   Lipase 50 11 - 51 U/L   US Abdomen Limited RUQ (LIVER/GB)  Result Date: 02/04/2022 CLINICAL DATA:  Transaminitis EXAM: ULTRASOUND ABDOMEN LIMITED RIGHT UPPER QUADRANT COMPARISON:  02/04/2022 FINDINGS: Gallbladder: No gallstones or wall thickening visualized. No sonographic Murphy sign noted by sonographer. Common bile duct: Diameter: 3 mm Liver: No focal lesion identified. Within normal limits in parenchymal echogenicity. Portal vein is patent on color Doppler imaging with normal direction of blood flow towards the liver. Other: None. IMPRESSION: 1. Unremarkable right upper quadrant ultrasound. Electronically Signed   By: Sharlet Salina M.D.   On: 02/04/2022 22:28   CT ABDOMEN PELVIS W CONTRAST  Result Date: 02/04/2022 CLINICAL DATA:  Right-sided flank pain for 1 week, initial encounter EXAM: CT ABDOMEN AND PELVIS WITH CONTRAST TECHNIQUE: Multidetector CT imaging of the abdomen and pelvis was performed using the standard protocol following bolus administration of intravenous contrast. RADIATION DOSE REDUCTION: This exam was performed according to the departmental dose-optimization program which includes automated exposure control, adjustment of the mA and/or kV according to patient size and/or use of iterative reconstruction technique. CONTRAST:  OMNIPAQUE IOHEXOL 300 MG/ML  SOLN COMPARISON:  None Available. FINDINGS: Lower chest: No acute abnormality. Hepatobiliary: No focal liver abnormality is seen. No gallstones, gallbladder wall thickening, or biliary dilatation. Pancreas: Unremarkable. No pancreatic ductal dilatation or surrounding inflammatory changes. Spleen: Normal in size without focal abnormality. Adrenals/Urinary Tract: Adrenal glands are within normal limits. Kidneys demonstrate a normal enhancement pattern bilaterally.  No renal calculi or obstructive changes are seen. The bladder is well distended. Stomach/Bowel: No obstructive or inflammatory changes of the colon are noted. The appendix is within normal limits. Small bowel and stomach are unremarkable with the exception of a moderate to large sliding-type hiatal hernia. Vascular/Lymphatic: Aortic atherosclerosis. No enlarged abdominal or pelvic lymph nodes. Reproductive: Status post hysterectomy. No adnexal masses. Other: No abdominal wall hernia or abnormality. No abdominopelvic ascites. Musculoskeletal: Degenerative changes of lumbar spine are noted. Mild anterolisthesis of L3 on L4 and L4 on L5 is noted. IMPRESSION: Moderate to large hiatal hernia. No acute abnormality is noted. Electronically Signed   By: Alcide Clever M.D.   On: 02/04/2022 21:19    RUQ [place] negative for acute findings.  Lipase WNL.  Patient appears stable for discharge.  Will follow-up with PCP/GI.  Return here for new concerns.   Garlon Hatchet, PA-C 02/04/22 [id number]    Garlon Hatchet, PA-C 02/04/22 2325    Glynn Octave, MD 02/04/22 [phone]

## 2022-02-05 ENCOUNTER — Encounter: Payer: Self-pay | Admitting: Physician Assistant

## 2022-02-05 LAB — HEPATITIS PANEL, ACUTE
HCV Ab: NONREACTIVE
Hep A IgM: NONREACTIVE
Hep B C IgM: NONREACTIVE
Hepatitis B Surface Ag: NONREACTIVE

## 2022-02-06 ENCOUNTER — Encounter: Payer: Self-pay | Admitting: Family Medicine

## 2022-02-06 ENCOUNTER — Ambulatory Visit (INDEPENDENT_AMBULATORY_CARE_PROVIDER_SITE_OTHER): Payer: Medicare HMO | Admitting: Family Medicine

## 2022-02-06 VITALS — BP 142/74 | HR 62 | Temp 97.4°F | Ht 68.0 in | Wt 192.0 lb

## 2022-02-06 DIAGNOSIS — R7989 Other specified abnormal findings of blood chemistry: Secondary | ICD-10-CM | POA: Diagnosis not present

## 2022-02-06 DIAGNOSIS — Z791 Long term (current) use of non-steroidal anti-inflammatories (NSAID): Secondary | ICD-10-CM | POA: Diagnosis not present

## 2022-02-06 DIAGNOSIS — R109 Unspecified abdominal pain: Secondary | ICD-10-CM | POA: Insufficient documentation

## 2022-02-06 DIAGNOSIS — R748 Abnormal levels of other serum enzymes: Secondary | ICD-10-CM | POA: Insufficient documentation

## 2022-02-06 LAB — POCT URINALYSIS DIPSTICK
Bilirubin, UA: NEGATIVE
Blood, UA: NEGATIVE
Glucose, UA: NEGATIVE
Ketones, UA: NEGATIVE
Leukocytes, UA: NEGATIVE
Nitrite, UA: NEGATIVE
Protein, UA: NEGATIVE
Spec Grav, UA: 1.01 (ref 1.010–1.025)
Urobilinogen, UA: 0.2 E.U./dL
pH, UA: 6 (ref 5.0–8.0)

## 2022-02-06 NOTE — Patient Instructions (Addendum)
Your symptoms appear to be related to a musculoskeletal problem.  Pain with movement speaks to this.  I recommend taking Tylenol 1000 mg twice daily.  Try cutting back on Aleve.  Use a heating pad on the area.  You can also try an over-the-counter topical pain medicine such as Salonpas with lidocaine.  Stay well-hydrated. Avoid alcohol  As discussed, see GI as scheduled next month for elevated liver enzymes.  Follow-up here as needed or at urgent care if you are worsening over the weekend.

## 2022-02-06 NOTE — Progress Notes (Signed)
Subjective:     Patient ID: Julia [name], female    DOB: 24-Jul-1950, 71 y.o.   MRN: [id number]  Chief Complaint  Patient presents with   Flank Pain    Right sided "nagging" constant pain for about 2 weeks, went to ER and they did some testing and said that her liver enzymes were elevated and made her a GI appt.     Flank Pain   Patient is in today for a 2 wk hx of right flank pain that is moving more towards her right lower abdomen. She was seen in the ED 2 days ago for the same.  Denies hx of renal stone.   Elevated LFTs with normal bilirubin and negative acute hepatitis panel done on 02/04/2022.  Negative lipase.   She admits to taking 2 Aleve twice daily for general aches and pain.   CT abdomen/pelvis negative except for large hiatal hernia. RUQ [place] negative  No fever, chills, dizziness, chest pain, palpitations, shortness of breath, nausea, vomiting, diarrhea, urinary symptoms.   Health Maintenance Due  Topic Date Due   DEXA SCAN  Never done    Past Medical History:  Diagnosis Date   Arthritis    GERD (gastroesophageal reflux disease)    Hyperlipidemia    Hypertension     Past Surgical History:  Procedure Laterality Date   BREAST BIOPSY     JOINT REPLACEMENT     Both knees    Family History  Problem Relation Age of Onset   Arthritis Mother    Heart disease Mother        Died of "poor circulation"    Hyperlipidemia Mother    Hypertension Mother    Cancer Father    Diabetes Sister    Hypertension Sister    Hypertension Brother    Drug abuse Brother    Heart disease Brother     Social History   Socioeconomic History   Marital status: Widowed    Spouse name: Not on file   Number of children: Not on file   Years of education: Not on file   Highest education level: Not on file  Occupational History   Not on file  Tobacco Use   Smoking status: Never   Smokeless tobacco: Never  Substance and Sexual Activity   Alcohol use: Never    Drug use: Never   Sexual activity: Yes  Other Topics Concern   Not on file  Social History Narrative   Three children.  5 grand.     Social Determinants of Health   Financial Resource Strain: Low Risk  (06/05/2021)   Overall Financial Resource Strain (CARDIA)    Difficulty of Paying Living Expenses: Not hard at all  Food Insecurity: No Food Insecurity (06/05/2021)   Hunger Vital Sign    Worried About Running Out of Food in the Last Year: Never true    Ran Out of Food in the Last Year: Never true  Transportation Needs: No Transportation Needs (06/05/2021)   PRAPARE - Administrator, Civil Service (Medical): No    Lack of Transportation (Non-Medical): No  Physical Activity: Sufficiently Active (06/05/2021)   Exercise Vital Sign    Days of Exercise per Week: 5 days    Minutes of Exercise per Session: 30 min  Stress: No Stress Concern Present (06/05/2021)   [employer] of Occupational Health - Occupational Stress Questionnaire    Feeling of Stress : Not at all  Social Connections: Socially Integrated (06/05/2021)  Social Advertising account executive [NHANES]    Frequency of Communication with Friends and Family: More than three times a week    Frequency of Social Gatherings with Friends and Family: More than three times a week    Attends Religious Services: More than 4 times per year    Active Member of [employer] or Organizations: Yes    Attends Banker Meetings: More than 4 times per year    Marital Status: Living with partner  Intimate Partner Violence: Not At Risk (06/05/2021)   Humiliation, Afraid, Rape, and Kick questionnaire    Fear of Current or Ex-Partner: No    Emotionally Abused: No    Physically Abused: No    Sexually Abused: No    Outpatient Medications Prior to Visit  Medication Sig Dispense Refill   amLODipine (NORVASC) 10 MG tablet Take 1 tablet (10 mg total) by mouth daily. 90 tablet 1   ASPIRIN 81 PO Take 81 mg by mouth daily in the  afternoon.     atorvastatin (LIPITOR) 40 MG tablet Take 1 tablet (40 mg total) by mouth daily. 90 tablet 1   BIOTIN PO Take 1 capsule by mouth daily in the afternoon.     bisoprolol (ZEBETA) 5 MG tablet Take 1 tablet (5 mg total) by mouth daily. 90 tablet 3   CALCIUM PO Take 1 capsule by mouth daily in the afternoon.     Cholecalciferol (VITAMIN D3) 50 MCG (2000 UT) capsule Take 1 capsule (2,000 Units total) by mouth daily. 100 capsule 3   Ferrous Gluconate (IRON 27 PO) Take by mouth.     GARLIC PO Take 1 capsule by mouth daily in the afternoon.     methocarbamol (ROBAXIN) 500 MG tablet Take 1 tablet (500 mg total) by mouth at bedtime as needed for muscle spasms. 20 tablet 0   Moringa 500 MG CAPS Take 1 capsule by mouth daily.     Multiple Vitamins-Minerals (CENTRUM SILVER PO) Take 1 tablet by mouth daily in the afternoon.     Omega-3 Fatty Acids (FISH OIL PO) Take 1 capsule by mouth daily in the afternoon.     omeprazole (PRILOSEC) 20 MG capsule Take 1 capsule (20 mg total) by mouth daily. 30 capsule 0   TURMERIC PO Take 1 capsule by mouth daily in the afternoon.     VITAMIN E PO Take 1 capsule by mouth daily in the afternoon.     No facility-administered medications prior to visit.    Allergies  Allergen Reactions   Pravastatin     myalgias    Review of Systems  Genitourinary:  Positive for flank pain.       Objective:    Physical Exam Constitutional:      General: She is not in acute distress.    Appearance: She is not ill-appearing.  HENT:     Mouth/Throat:     Mouth: Mucous membranes are moist.  Cardiovascular:     Rate and Rhythm: Normal rate and regular rhythm.  Pulmonary:     Effort: Pulmonary effort is normal.     Breath sounds: Normal breath sounds.  Abdominal:     General: Bowel sounds are normal. There is no distension.     Palpations: Abdomen is soft.     Tenderness: There is abdominal tenderness in the right lower quadrant. There is no right CVA  tenderness, left CVA tenderness, guarding or rebound. Negative signs include Murphy's sign and McBurney's sign.  Comments: TTP from right mid lumbar spine to RLQ without rash or erythema. No hyper or hypoesthesia. Pain with movement to the same area.   Skin:    General: Skin is warm and dry.     Coloration: Skin is not jaundiced.  Neurological:     General: No focal deficit present.     Mental Status: She is alert and oriented to person, place, and time.  Psychiatric:        Mood and Affect: Mood normal.        Behavior: Behavior normal.        Thought Content: Thought content normal.     BP (!) 142/74 (BP Location: Left Arm, Patient Position: Sitting, Cuff Size: Large)   Pulse 62   Temp (!) 97.4 F (36.3 C) (Temporal)   Ht 5\' 8"  (1.727 m)   Wt 192 lb (87.1 kg)   SpO2 95%   BMI 29.19 kg/m  Wt Readings from Last 3 Encounters:  02/06/22 192 lb (87.1 kg)  11/10/21 195 lb 6.4 oz (88.6 kg)  08/08/21 196 lb (88.9 kg)       Assessment & Plan:   Problem List Items Addressed This Visit       Other   Acute flank pain   Relevant Orders   POCT urinalysis dipstick (Completed)   Elevated liver enzymes   Elevated serum creatinine   NSAID long-term use   Right sided abdominal pain - Primary   Relevant Orders   POCT urinalysis dipstick (Completed)   Reviewed notes from the ED along with lab results, urinalysis, CT abdomen pelvis and right upper quadrant ultrasound.  She is here today with same pain.  Symptoms appear to be musculoskeletal.  She is having significant pain with movement. She is not in any acute distress. Urinalysis dipstick negative. Advised her to cut back on Aleve as she has been taking at least 4 Aleve per day for several weeks to months.  Advised that NSAIDs can affect liver and kidney and may be causing her elevated serum creatinine and liver enzymes.  She has an appointment with GI next month.  Encouraged her to avoid alcohol. Discussed taking Tylenol 1000  mg twice daily, using a heating pad and topical analgesic for pain control.  Follow-up if worsening or not improving in the next few days.  I am having Julia [name] maintain her ASPIRIN 81 PO, GARLIC PO, CALCIUM PO, TURMERIC PO, VITAMIN E PO, BIOTIN PO, Ferrous Gluconate (IRON 27 PO), Multiple Vitamins-Minerals (CENTRUM SILVER PO), Vitamin D3, Omega-3 Fatty Acids (FISH OIL PO), bisoprolol, Moringa, amLODipine, atorvastatin, omeprazole, and methocarbamol.  No orders of the defined types were placed in this encounter.

## 2022-03-05 ENCOUNTER — Other Ambulatory Visit: Payer: Self-pay

## 2022-03-10 ENCOUNTER — Other Ambulatory Visit (INDEPENDENT_AMBULATORY_CARE_PROVIDER_SITE_OTHER): Payer: Medicare HMO

## 2022-03-10 ENCOUNTER — Encounter: Payer: Self-pay | Admitting: Physician Assistant

## 2022-03-10 ENCOUNTER — Ambulatory Visit: Payer: Medicare HMO | Admitting: Physician Assistant

## 2022-03-10 VITALS — BP 130/70 | HR 60 | Ht 68.0 in | Wt 195.4 lb

## 2022-03-10 DIAGNOSIS — R109 Unspecified abdominal pain: Secondary | ICD-10-CM | POA: Diagnosis not present

## 2022-03-10 DIAGNOSIS — R7989 Other specified abnormal findings of blood chemistry: Secondary | ICD-10-CM

## 2022-03-10 DIAGNOSIS — Z1211 Encounter for screening for malignant neoplasm of colon: Secondary | ICD-10-CM | POA: Diagnosis not present

## 2022-03-10 LAB — SEDIMENTATION RATE: Sed Rate: 71 mm/hr — ABNORMAL HIGH (ref 0–30)

## 2022-03-10 LAB — FERRITIN: Ferritin: 342.4 ng/mL — ABNORMAL HIGH (ref 10.0–291.0)

## 2022-03-10 LAB — COMPREHENSIVE METABOLIC PANEL
ALT: 549 U/L — ABNORMAL HIGH (ref 0–35)
AST: 457 U/L — ABNORMAL HIGH (ref 0–37)
Albumin: 4.2 g/dL (ref 3.5–5.2)
Alkaline Phosphatase: 148 U/L — ABNORMAL HIGH (ref 39–117)
BUN: 15 mg/dL (ref 6–23)
CO2: 22 mEq/L (ref 19–32)
Calcium: 9.9 mg/dL (ref 8.4–10.5)
Chloride: 106 mEq/L (ref 96–112)
Creatinine, Ser: 1.34 mg/dL — ABNORMAL HIGH (ref 0.40–1.20)
GFR: 40.46 mL/min — ABNORMAL LOW (ref >60.00)
Glucose, Bld: 73 mg/dL (ref 70–99)
Potassium: 4.1 mEq/L (ref 3.5–5.1)
Sodium: 140 mEq/L (ref 135–145)
Total Bilirubin: 0.6 mg/dL (ref 0.2–1.2)
Total Protein: 8.2 g/dL (ref 6.0–8.3)

## 2022-03-10 NOTE — Patient Instructions (Signed)
Your provider has requested that you go to the basement level for lab work before leaving today. Press "B" on the elevator. The lab is located at the first door on the left as you exit the elevator.  We will try to obtain your colonoscopy report from your previous primary care physician.   The [place] [facility] providers would like to encourage you to use [facility] to communicate with providers for non-urgent requests or questions.  Due to long hold times on the telephone, sending your provider a message by [facility] may be a faster and more efficient way to get a response.  Please allow 48 business hours for a response.  Please remember that this is for non-urgent requests.   Due to recent changes in healthcare laws, you may see the results of your imaging and laboratory studies on MyChart before your provider has had a chance to review them.  We understand that in some cases there may be results that are confusing or concerning to you. Not all laboratory results come back in the same time frame and the provider may be waiting for multiple results in order to interpret others.  Please give [place] 48 hours in order for your provider to thoroughly review all the results before contacting the office for clarification of your results.

## 2022-03-10 NOTE — Progress Notes (Addendum)
Subjective:    Patient ID: Julia [name], female    DOB: 1950-06-23, 72 y.o.   MRN: [id number]  HPI  Louella is a pleasant 72 year old African-American female, new to GI today referred by the emergency room after recent ER visit on 02/04/2022. She says that she had been having some right flank pain for a couple of weeks prior to that ER visit, and when it persisted and seem to get a bit worse she went to the emergency room.  At home this was worsened by movement, did not appear to be associated with eating, no associated nausea or vomiting no fever or chills, no dysuria.  She does not recall injuring herself but had picked up a lot of boxes moving Christmas decorations prior to that time. In the emergency room she underwent CT of the abdomen pelvis which showed a normal-appearing liver and gallbladder, no ductal dilation.  She does have a moderate to large sliding hiatal hernia and exam was otherwise negative. She also had upper abdominal ultrasound which was negative. CBC with WBC 4.2/hemoglobin 12.5/platelets 219 Lipase within normal limits T. bili 0.6/alk phos 100/AST 260/ALT 253 Acute hepatitis serologies negative.  Reviewing her chart she did have LFTs done in September 2023 which were normal. She says that her right flank pain is still present but not as bad as it was, she has been using a heating pad and over-the-counter pain relief gels etc. which do help.  She describes it as a constant nagging pain that radiates around into her back. Again appetite is fine, no complaints of nausea or vomiting no increase in pain postprandially, no fevers.  She has a bowel movement every couple of days and has not noticed any changes in her bowel habits no melena or hematochezia. She does not have any history of elevated LFTs to her knowledge, no history of prior hepatitis or any underlying liver issues. She had been taking Aleve on a regular basis and has switched that out for Tylenol to 500 mg  tablets twice daily. She started Lipitor about 1 year ago which was a switch from pravastatin which was causing muscle aches. No family history of liver disease that she is aware of her grand daughter and sister both have lupus.  She has undergone prior endoscopies and colonoscopies.  She had previously been living in [facility] and thinks her last colonoscopy was in 2015 or 9562 is not certain whether or not she had polyps.  She was told at EGD that she has a hiatal hernia.  He has not been requiring any acid blocker over the past few years and is not having any regular heartburn or indigestion.  Review of Systems Pertinent positive and negative review of systems were noted in the above HPI section.  All other review of systems was otherwise negative.   Outpatient Encounter Medications as of 03/10/2022  Medication Sig   amLODipine (NORVASC) 10 MG tablet Take 1 tablet (10 mg total) by mouth daily.   ASPIRIN 81 PO Take 81 mg by mouth daily in the afternoon.   atorvastatin (LIPITOR) 40 MG tablet Take 1 tablet (40 mg total) by mouth daily.   BIOTIN PO Take 1 capsule by mouth daily in the afternoon.   bisoprolol (ZEBETA) 5 MG tablet Take 1 tablet (5 mg total) by mouth daily.   CALCIUM PO Take 1 capsule by mouth daily in the afternoon.   Cholecalciferol (VITAMIN D3) 50 MCG (2000 UT) capsule Take 1 capsule (2,000 Units total) by  mouth daily.   Ferrous Gluconate (IRON 27 PO) Take by mouth.   Flaxseed, Linseed, (FLAXSEED OIL) 1000 MG CAPS Take 1 capsule by mouth daily in the afternoon.   GARLIC PO Take 1 capsule by mouth daily in the afternoon.   methocarbamol (ROBAXIN) 500 MG tablet Take 1 tablet (500 mg total) by mouth at bedtime as needed for muscle spasms.   Moringa 500 MG CAPS Take 1 capsule by mouth daily.   Multiple Vitamins-Minerals (CENTRUM SILVER PO) Take 1 tablet by mouth daily in the afternoon.   Omega-3 Fatty Acids (FISH OIL PO) Take 1 capsule by mouth daily in the afternoon.   omeprazole  (PRILOSEC) 20 MG capsule Take 1 capsule (20 mg total) by mouth daily.   TURMERIC PO Take 1 capsule by mouth daily in the afternoon.   Vitamin D-Vitamin K (VITAMIN K2-VITAMIN D3 PO) Take 5,000 Units by mouth daily in the afternoon.   VITAMIN E PO Take 1 capsule by mouth daily in the afternoon.   No facility-administered encounter medications on file as of 03/10/2022.   Allergies  Allergen Reactions   Pravastatin     myalgias   Patient Active Problem List   Diagnosis Date Noted   Right sided abdominal pain 02/06/2022   Acute flank pain 02/06/2022   Elevated liver enzymes 02/06/2022   NSAID long-term use 02/06/2022   Elevated serum creatinine 02/06/2022   Paresthesia 11/10/2021   Renal insufficiency 11/09/2020   Elevated coronary artery calcium score 07/03/2020   Shoulder pain, right 02/08/2020   Ringworm 04/26/2019   Coronary atherosclerosis 03/13/2019   Education about diabetes not performed 02/06/2019   Healthcare maintenance 09/12/2018   Osteoarthritis 09/12/2018   HTN (hypertension) 09/08/2018   Dyslipidemia 09/08/2018   Palpitations 09/08/2018   Social History   Socioeconomic History   Marital status: Widowed    Spouse name: Not on file   Number of children: Not on file   Years of education: Not on file   Highest education level: Not on file  Occupational History   Not on file  Tobacco Use   Smoking status: Never   Smokeless tobacco: Never  Vaping Use   Vaping Use: Never used  Substance and Sexual Activity   Alcohol use: Never   Drug use: Never   Sexual activity: Yes  Other Topics Concern   Not on file  Social History Narrative   Three children.  5 grand.     Social Determinants of Health   Financial Resource Strain: Low Risk  (06/05/2021)   Overall Financial Resource Strain (CARDIA)    Difficulty of Paying Living Expenses: Not hard at all  Food Insecurity: No Food Insecurity (06/05/2021)   Hunger Vital Sign    Worried About Running Out of Food in the  Last Year: Never true    Ran Out of Food in the Last Year: Never true  Transportation Needs: No Transportation Needs (06/05/2021)   PRAPARE - Administrator, Civil Service (Medical): No    Lack of Transportation (Non-Medical): No  Physical Activity: Sufficiently Active (06/05/2021)   Exercise Vital Sign    Days of Exercise per Week: 5 days    Minutes of Exercise per Session: 30 min  Stress: No Stress Concern Present (06/05/2021)   [employer] of Occupational Health - Occupational Stress Questionnaire    Feeling of Stress : Not at all  Social Connections: Socially Integrated (06/05/2021)   Social Connection and Isolation Panel [NHANES]    Frequency of Communication  with Friends and Family: More than three times a week    Frequency of Social Gatherings with Friends and Family: More than three times a week    Attends Religious Services: More than 4 times per year    Active Member of [employer] or Organizations: Yes    Attends Archivist Meetings: More than 4 times per year    Marital Status: Living with partner  Intimate Partner Violence: Not At Risk (06/05/2021)   Humiliation, Afraid, Rape, and Kick questionnaire    Fear of Current or Ex-Partner: No    Emotionally Abused: No    Physically Abused: No    Sexually Abused: No    Ms. Prudhomme-Robinson's family history includes Arthritis in her mother; Cancer in her father; Diabetes in her sister; Drug abuse in her brother; Heart disease in her brother and mother; Hyperlipidemia in her mother; Hypertension in her brother, mother, and sister; Pancreatic cancer in her father; Prostate cancer in her brother.      Objective:    Vitals:   03/10/22 1029  BP: 130/70  Pulse: 60    Physical Exam Well-developed well-nourished  AA female in no acute distress.  Height, Weight,195  BMI29.7  HEENT; nontraumatic normocephalic, EOMI, PE R LA, sclera anicteric. Oropharynx;not examined today  Neck; supple, no JVD Cardiovascular;  regular rate and rhythm with S1-S2, no murmur rub or gallop Pulmonary; Clear bilaterally, there is tenderness to palpation along the right lower posterior ribs around laterally Abdomen; soft, nontender, nondistended, no palpable mass or hepatosplenomegaly, bowel sounds are active Rectal; not done today Skin; benign exam, no jaundice rash or appreciable lesions Extremities; no clubbing cyanosis or edema skin warm and dry Neuro/Psych; alert and oriented x4, grossly nonfocal mood and affect appropriate        Assessment & Plan:   #16 72 year old African-American female with right flank pain which on exam is musculoskeletal in etiology with tenderness along the right lower posterior ribs.  #2 elevated transaminases found incidentally with labs December 2023 during ER visit for this right flank pain. Etiology is not clear, no prior history of elevated LFTs. Both CT scan abdomen and pelvis and ultrasound both showing a normal-appearing liver, no gallstones and no ductal dilation.  Considered drug-induced i.e. Lipitor, consider underlying chronic hepatitis though previous LFTs in September had been normal.  #3 moderate to large sliding hiatal hernia noted on CT-currently asymptomatic not requiring any PPI #4 colon cancer screening-patient had prior colonoscopy and EGD in [facility].  Last colonoscopy probably 2015 or 16 Patient uncertain about polyps or indicated follow-up interval  #5 hypertension #6.  Osteoarthritis  Plan; patient has signed a release and will obtain her old records from [facility] regarding timing of last colonoscopy and indicated interval for follow-up colonoscopy. Will check c-Met, sed rate, ANA, and ferritin today. If transaminases remain elevated she may need to come off Lipitor as initial step. Continue heating pad and topical pain relievers for the musculoskeletal right posterior rib/flank pain.  If this is persisting beyond the next month then she may need to have  further imaging or referral to orthopedics.  Patient will be established with Dr. Silverio Decamp.   Addendum -Records received from prior colonoscopy/Charles Maudie Mercury, MD [facility]-September 2016-normal colonoscopy with excellent prep recommended 10-year interval follow-up. Will plan for follow-up colonoscopy here fall 2026 Dr. Silverio Decamp.   Teleshia Lemere Genia Harold PA-C 03/10/2022   Cc: Plotnikov, Evie Lacks, MD

## 2022-03-12 LAB — ANA: Anti Nuclear Antibody (ANA): POSITIVE — AB

## 2022-03-12 LAB — ANTI-NUCLEAR AB-TITER (ANA TITER)
ANA TITER: 1:160 {titer} — ABNORMAL HIGH
ANA Titer 1: 1:160 {titer} — ABNORMAL HIGH

## 2022-03-25 ENCOUNTER — Telehealth: Payer: Self-pay

## 2022-03-25 NOTE — Telephone Encounter (Signed)
Patient contacted and advised. Recall entered into EPIC.

## 2022-03-25 NOTE — Telephone Encounter (Signed)
-----  Message from Alfredia Ferguson, [place] sent at 03/23/2022  4:26 PM EST ----- Regarding: recall Colon Beth-please call or message patient and let her know we received her old records and her last colonoscopy done in [facility] was done in September 2016 and was a normal exam/no polyps so she will be due for follow-up colonoscopy September 2026  Please put in a notice for recall colonoscopy with Dr. Silverio Decamp ,September 2026 thank you

## 2022-04-22 ENCOUNTER — Other Ambulatory Visit: Payer: Self-pay | Admitting: Internal Medicine

## 2022-05-11 ENCOUNTER — Encounter: Payer: Self-pay | Admitting: Internal Medicine

## 2022-05-11 ENCOUNTER — Ambulatory Visit (INDEPENDENT_AMBULATORY_CARE_PROVIDER_SITE_OTHER): Payer: Medicare HMO | Admitting: Internal Medicine

## 2022-05-11 VITALS — BP 110/78 | HR 60 | Temp 98.0°F | Ht 68.0 in | Wt 192.0 lb

## 2022-05-11 DIAGNOSIS — N289 Disorder of kidney and ureter, unspecified: Secondary | ICD-10-CM | POA: Diagnosis not present

## 2022-05-11 DIAGNOSIS — E785 Hyperlipidemia, unspecified: Secondary | ICD-10-CM

## 2022-05-11 DIAGNOSIS — I10 Essential (primary) hypertension: Secondary | ICD-10-CM

## 2022-05-11 DIAGNOSIS — M2550 Pain in unspecified joint: Secondary | ICD-10-CM | POA: Diagnosis not present

## 2022-05-11 DIAGNOSIS — R748 Abnormal levels of other serum enzymes: Secondary | ICD-10-CM | POA: Diagnosis not present

## 2022-05-11 DIAGNOSIS — Z0000 Encounter for general adult medical examination without abnormal findings: Secondary | ICD-10-CM | POA: Diagnosis not present

## 2022-05-11 NOTE — Assessment & Plan Note (Signed)
AM stiffness - 45 min Hold lipitor x 1-2 wks, or Norvasc

## 2022-05-11 NOTE — Patient Instructions (Signed)
Blue-Emu cream -- use 2-3 times a day ? ?

## 2022-05-11 NOTE — Assessment & Plan Note (Signed)
Labs tomorrow Hold lipitor x 1-2 wks, or Norvasc

## 2022-05-11 NOTE — Progress Notes (Signed)
Subjective:  Patient ID: Julia [name], female    DOB: 11-12-1950  Age: 72 y.o. MRN: [id number]  CC: Annual Exam   HPI Julia [name] presents for hands OA, elevated ANA, elevated LFTs AM stiffness - 45 min  Outpatient Medications Prior to Visit  Medication Sig Dispense Refill   amLODipine (NORVASC) 10 MG tablet Take 1 tablet (10 mg total) by mouth daily. 90 tablet 1   ASPIRIN 81 PO Take 81 mg by mouth daily in the afternoon.     atorvastatin (LIPITOR) 40 MG tablet Take 1 tablet (40 mg total) by mouth daily. 90 tablet 1   BIOTIN PO Take 1 capsule by mouth daily in the afternoon.     bisoprolol (ZEBETA) 5 MG tablet Take 1 tablet by mouth once daily 90 tablet 2   CALCIUM PO Take 1 capsule by mouth daily in the afternoon.     Cholecalciferol (VITAMIN D3) 50 MCG (2000 UT) capsule Take 1 capsule (2,000 Units total) by mouth daily. 100 capsule 3   Ferrous Gluconate (IRON 27 PO) Take by mouth.     Flaxseed, Linseed, (FLAXSEED OIL) 1000 MG CAPS Take 1 capsule by mouth daily in the afternoon.     GARLIC PO Take 1 capsule by mouth daily in the afternoon.     methocarbamol (ROBAXIN) 500 MG tablet Take 1 tablet (500 mg total) by mouth at bedtime as needed for muscle spasms. 20 tablet 0   Moringa 500 MG CAPS Take 1 capsule by mouth daily.     Multiple Vitamins-Minerals (CENTRUM SILVER PO) Take 1 tablet by mouth daily in the afternoon.     Omega-3 Fatty Acids (FISH OIL PO) Take 1 capsule by mouth daily in the afternoon.     omeprazole (PRILOSEC) 20 MG capsule Take 1 capsule (20 mg total) by mouth daily. 30 capsule 0   TURMERIC PO Take 1 capsule by mouth daily in the afternoon.     Vitamin D-Vitamin K (VITAMIN K2-VITAMIN D3 PO) Take 5,000 Units by mouth daily in the afternoon.     VITAMIN E PO Take 1 capsule by mouth daily in the afternoon.     No facility-administered medications prior to visit.    ROS: Review of Systems  Constitutional:  Negative for activity change,  appetite change, chills, fatigue and unexpected weight change.  HENT:  Negative for congestion, mouth sores and sinus pressure.   Eyes:  Negative for visual disturbance.  Respiratory:  Negative for cough and chest tightness.   Gastrointestinal:  Negative for abdominal pain and nausea.  Genitourinary:  Negative for difficulty urinating, frequency and vaginal pain.  Musculoskeletal:  Positive for arthralgias. Negative for back pain and gait problem.  Skin:  Negative for pallor and rash.  Neurological:  Negative for dizziness, tremors, weakness, numbness and headaches.  Psychiatric/Behavioral:  Negative for confusion and sleep disturbance.     Objective:  BP 110/78 (BP Location: Right Arm, Patient Position: Sitting, Cuff Size: Normal)   Pulse 60   Temp 98 F (36.7 C) (Oral)   Ht 5\' 8"  (1.727 m)   Wt 192 lb (87.1 kg)   SpO2 98%   BMI 29.19 kg/m   BP Readings from Last 3 Encounters:  05/11/22 110/78  03/10/22 130/70  02/06/22 (!) 142/74    Wt Readings from Last 3 Encounters:  05/11/22 192 lb (87.1 kg)  03/10/22 195 lb 6 oz (88.6 kg)  02/06/22 192 lb (87.1 kg)    Physical Exam Constitutional:  General: She is not in acute distress.    Appearance: She is well-developed. She is obese.  HENT:     Head: Normocephalic.     Right Ear: External ear normal.     Left Ear: External ear normal.     Nose: Nose normal.  Eyes:     General:        Right eye: No discharge.        Left eye: No discharge.     Conjunctiva/sclera: Conjunctivae normal.     Pupils: Pupils are equal, round, and reactive to light.  Neck:     Thyroid: No thyromegaly.     Vascular: No JVD.     Trachea: No tracheal deviation.  Cardiovascular:     Rate and Rhythm: Normal rate and regular rhythm.     Heart sounds: Normal heart sounds.  Pulmonary:     Effort: No respiratory distress.     Breath sounds: No stridor. No wheezing.  Abdominal:     General: Bowel sounds are normal. There is no distension.      Palpations: Abdomen is soft. There is no mass.     Tenderness: There is no abdominal tenderness. There is no guarding or rebound.  Musculoskeletal:        General: No tenderness.     Cervical back: Normal range of motion and neck supple. No rigidity.  Lymphadenopathy:     Cervical: No cervical adenopathy.  Skin:    Findings: No erythema or rash.  Neurological:     Cranial Nerves: No cranial nerve deficit.     Motor: No abnormal muscle tone.     Coordination: Coordination normal.     Deep Tendon Reflexes: Reflexes normal.  Psychiatric:        Behavior: Behavior normal.        Thought Content: Thought content normal.        Judgment: Judgment normal.     Lab Results  Component Value Date   WBC 4.2 02/04/2022   HGB 12.5 02/04/2022   HCT 37.6 02/04/2022   PLT 219 02/04/2022   GLUCOSE 73 03/10/2022   CHOL 191 11/05/2020   TRIG 123.0 11/05/2020   HDL 56.20 11/05/2020   LDLCALC 110 (H) 11/05/2020   ALT 549 (H) 03/10/2022   AST 457 (H) 03/10/2022   NA 140 03/10/2022   K 4.1 03/10/2022   CL 106 03/10/2022   CREATININE 1.34 (H) 03/10/2022   BUN 15 03/10/2022   CO2 22 03/10/2022   TSH 1.19 11/05/2020    US Abdomen Limited RUQ (LIVER/GB)  Result Date: 02/04/2022 CLINICAL DATA:  Transaminitis EXAM: ULTRASOUND ABDOMEN LIMITED RIGHT UPPER QUADRANT COMPARISON:  02/04/2022 FINDINGS: Gallbladder: No gallstones or wall thickening visualized. No sonographic Murphy sign noted by sonographer. Common bile duct: Diameter: 3 mm Liver: No focal lesion identified. Within normal limits in parenchymal echogenicity. Portal vein is patent on color Doppler imaging with normal direction of blood flow towards the liver. Other: None. IMPRESSION: 1. Unremarkable right upper quadrant ultrasound. Electronically Signed   By: Randa Ngo M.D.   On: 02/04/2022 22:28   CT ABDOMEN PELVIS W CONTRAST  Result Date: 02/04/2022 CLINICAL DATA:  Right-sided flank pain for 1 week, initial encounter EXAM: CT  ABDOMEN AND PELVIS WITH CONTRAST TECHNIQUE: Multidetector CT imaging of the abdomen and pelvis was performed using the standard protocol following bolus administration of intravenous contrast. RADIATION DOSE REDUCTION: This exam was performed according to the departmental dose-optimization program which includes automated exposure control, adjustment  of the mA and/or kV according to patient size and/or use of iterative reconstruction technique. CONTRAST:  149mL OMNIPAQUE IOHEXOL 300 MG/ML  SOLN COMPARISON:  None Available. FINDINGS: Lower chest: No acute abnormality. Hepatobiliary: No focal liver abnormality is seen. No gallstones, gallbladder wall thickening, or biliary dilatation. Pancreas: Unremarkable. No pancreatic ductal dilatation or surrounding inflammatory changes. Spleen: Normal in size without focal abnormality. Adrenals/Urinary Tract: Adrenal glands are within normal limits. Kidneys demonstrate a normal enhancement pattern bilaterally. No renal calculi or obstructive changes are seen. The bladder is well distended. Stomach/Bowel: No obstructive or inflammatory changes of the colon are noted. The appendix is within normal limits. Small bowel and stomach are unremarkable with the exception of a moderate to large sliding-type hiatal hernia. Vascular/Lymphatic: Aortic atherosclerosis. No enlarged abdominal or pelvic lymph nodes. Reproductive: Status post hysterectomy. No adnexal masses. Other: No abdominal wall hernia or abnormality. No abdominopelvic ascites. Musculoskeletal: Degenerative changes of lumbar spine are noted. Mild anterolisthesis of L3 on L4 and L4 on L5 is noted. IMPRESSION: Moderate to large hiatal hernia. No acute abnormality is noted. Electronically Signed   By: Inez Catalina M.D.   On: 02/04/2022 21:19    Assessment & Plan:   Problem List Items Addressed This Visit       Cardiovascular and Mediastinum   HTN (hypertension) - Primary     Genitourinary   Renal insufficiency    Relevant Orders   CBC with Differential/Platelet   Comprehensive metabolic panel   Sedimentation rate   ANA+ENA+DNA/DS+Antich+Centr     Other   Healthcare maintenance   Relevant Orders   CBC with Differential/Platelet   Comprehensive metabolic panel   TSH   VITAMIN D 25 Hydroxy (Vit-D Deficiency, Fractures)   Urinalysis, Routine w reflex microscopic   Elevated liver enzymes    Labs tomorrow Hold lipitor x 1-2 wks, or Norvasc      Relevant Orders   ANA+ENA+DNA/DS+Antich+Centr   Dyslipidemia    AM stiffness - 45 min Hold lipitor x 1-2 wks, or Norvasc      Relevant Orders   Lipid panel   Arthralgia    AM stiffness - 45 min Hold lipitor x 1-2 wks, or Norvasc         No orders of the defined types were placed in this encounter.     Follow-up: Return in about 6 weeks (around 06/22/2022) for a follow-up visit.  Walker Kehr, MD

## 2022-05-12 ENCOUNTER — Telehealth: Payer: Self-pay | Admitting: *Deleted

## 2022-05-12 DIAGNOSIS — N289 Disorder of kidney and ureter, unspecified: Secondary | ICD-10-CM

## 2022-05-12 DIAGNOSIS — R748 Abnormal levels of other serum enzymes: Secondary | ICD-10-CM

## 2022-05-12 LAB — URINALYSIS, ROUTINE W REFLEX MICROSCOPIC
Bilirubin Urine: NEGATIVE
Hgb urine dipstick: NEGATIVE
Ketones, ur: NEGATIVE
Nitrite: NEGATIVE
RBC / HPF: NONE SEEN (ref 0–?)
Specific Gravity, Urine: <=1.005 — AB (ref 1.000–1.030)
Total Protein, Urine: NEGATIVE
Urine Glucose: NEGATIVE
Urobilinogen, UA: 0.2 (ref 0.0–1.0)
pH: 7 (ref 5.0–8.0)

## 2022-05-12 LAB — TSH: TSH: 0.78 u[IU]/mL (ref 0.35–5.50)

## 2022-05-12 LAB — CBC WITH DIFFERENTIAL/PLATELET
Basophils Absolute: 0 10*3/uL (ref 0.0–0.1)
Basophils Relative: 0.8 % (ref 0.0–3.0)
Eosinophils Absolute: 0.1 10*3/uL (ref 0.0–0.7)
Eosinophils Relative: 3.1 % (ref 0.0–5.0)
HCT: 42 % (ref 36.0–46.0)
Hemoglobin: 14.2 g/dL (ref 12.0–15.0)
Lymphocytes Relative: 35.9 % (ref 12.0–46.0)
Lymphs Abs: 1.2 10*3/uL (ref 0.7–4.0)
MCHC: 33.8 g/dL (ref 30.0–36.0)
MCV: 94.6 fl (ref 78.0–100.0)
Monocytes Absolute: 0.4 10*3/uL (ref 0.1–1.0)
Monocytes Relative: 10.8 % (ref 3.0–12.0)
Neutro Abs: 1.6 10*3/uL (ref 1.4–7.7)
Neutrophils Relative %: 49.4 % (ref 43.0–77.0)
Platelets: 184 10*3/uL (ref 150.0–400.0)
RBC: 4.44 Mil/uL (ref 3.87–5.11)
RDW: 14.4 % (ref 11.5–15.5)
WBC: 3.3 10*3/uL — ABNORMAL LOW (ref 4.0–10.5)

## 2022-05-12 LAB — LIPID PANEL
Cholesterol: 204 mg/dL — ABNORMAL HIGH (ref 0–200)
HDL: 72.5 mg/dL (ref >39.00)
LDL Cholesterol: 110 mg/dL — ABNORMAL HIGH (ref 0–99)
NonHDL: 131.16
Total CHOL/HDL Ratio: 3
Triglycerides: 104 mg/dL (ref 0.0–149.0)
VLDL: 20.8 mg/dL (ref 0.0–40.0)

## 2022-05-12 LAB — COMPREHENSIVE METABOLIC PANEL
ALT: 62 U/L — ABNORMAL HIGH (ref 0–35)
AST: 73 U/L — ABNORMAL HIGH (ref 0–37)
Albumin: 3.5 g/dL (ref 3.5–5.2)
Alkaline Phosphatase: 96 U/L (ref 39–117)
BUN: 15 mg/dL (ref 6–23)
CO2: 24 mEq/L (ref 19–32)
Calcium: 9.3 mg/dL (ref 8.4–10.5)
Chloride: 103 mEq/L (ref 96–112)
Creatinine, Ser: 0.96 mg/dL (ref 0.40–1.20)
GFR: 60.3 mL/min (ref >60.00)
Glucose, Bld: 75 mg/dL (ref 70–99)
Potassium: 4 mEq/L (ref 3.5–5.1)
Sodium: 138 mEq/L (ref 135–145)
Total Bilirubin: 0.6 mg/dL (ref 0.2–1.2)
Total Protein: 7.4 g/dL (ref 6.0–8.3)

## 2022-05-12 LAB — VITAMIN D 25 HYDROXY (VIT D DEFICIENCY, FRACTURES): VITD: 65.44 ng/mL (ref 30.00–100.00)

## 2022-05-12 LAB — SEDIMENTATION RATE: Sed Rate: 51 mm/hr — ABNORMAL HIGH (ref 0–30)

## 2022-05-12 NOTE — Telephone Encounter (Signed)
Per Tammy not able to pull up [id number]. Will order the [id number] per [facility] not sure if all he need are in profile.Place order [phone].../lmb  We need DS-DNA and ANA. thx

## 2022-05-12 NOTE — Telephone Encounter (Signed)
the test code [phone] isnt coming up, I called [facility] and they asked if the tests you're trying to order are all in this panel. try this test code [phone]. if it looks ok could you reorder with the new test code? Are you tring to order ANA 12Plus profile  Do all RDL??

## 2022-05-12 NOTE — Telephone Encounter (Signed)
Called [facility] the [phone] code is not one of there and they are not able to pull up. Code # [id number] is consist of  This profile is comprised of Anti?Nuclear Ab (ANA) by IFA, Anti?dsDNA Ab by Concha Pyo, Anti?ENA Abs (Anti?Sm & Anti?RNP), Anti?Centromere Ab, C3 & C4 Complements, Anti?Ro (SS?A) Ab, Anti?La (SS?B) Ab, Anti?Scl?70 Ab, Anti?cardiolipin Abs (IgG, IgA & IgM isotypes), Anti?TPO (Thyroid Microsomal Peroxidase) Ab, Anti?Chromatin Ab, Rheumatoid Factor by Turb, Anti?CCP (Cyclic Citrullinated Peptide) Ab.  Is this what you want??

## 2022-05-13 NOTE — Telephone Encounter (Signed)
I already ordered it.  Please see if it is a correct test.  Thank you

## 2022-05-13 NOTE — Addendum Note (Signed)
Addended by: Cassandria Anger on: 05/13/2022 07:23 AM   Modules accepted: Orders

## 2022-05-13 NOTE — Telephone Encounter (Signed)
Called pt made lab appt for tomorrow 05/14/22.Marland KitchenJohny [name]

## 2022-05-14 ENCOUNTER — Other Ambulatory Visit: Payer: Medicare HMO

## 2022-05-14 DIAGNOSIS — N289 Disorder of kidney and ureter, unspecified: Secondary | ICD-10-CM | POA: Diagnosis not present

## 2022-05-14 DIAGNOSIS — R748 Abnormal levels of other serum enzymes: Secondary | ICD-10-CM | POA: Diagnosis not present

## 2022-05-18 LAB — ANA+ENA+DNA/DS+ANTICH+CENTR
ANA Titer 1: NEGATIVE
Anti JO-1: <0.2 AI (ref 0.0–0.9)
Centromere Ab Screen: <0.2 AI (ref 0.0–0.9)
Chromatin Ab SerPl-aCnc: <0.2 AI (ref 0.0–0.9)
ENA RNP Ab: 1.8 AI — ABNORMAL HIGH (ref 0.0–0.9)
ENA SM Ab Ser-aCnc: <0.2 AI (ref 0.0–0.9)
ENA SSA (RO) Ab: <0.2 AI (ref 0.0–0.9)
ENA SSB (LA) Ab: <0.2 AI (ref 0.0–0.9)
Scleroderma (Scl-70) (ENA) Antibody, IgG: <0.2 AI (ref 0.0–0.9)
dsDNA Ab: <1 IU/mL (ref 0–9)

## 2022-06-08 ENCOUNTER — Ambulatory Visit (INDEPENDENT_AMBULATORY_CARE_PROVIDER_SITE_OTHER): Payer: Medicare HMO

## 2022-06-08 VITALS — Ht 69.0 in | Wt 195.0 lb

## 2022-06-08 DIAGNOSIS — Z780 Asymptomatic menopausal state: Secondary | ICD-10-CM

## 2022-06-08 DIAGNOSIS — Z0000 Encounter for general adult medical examination without abnormal findings: Secondary | ICD-10-CM | POA: Diagnosis not present

## 2022-06-08 NOTE — Progress Notes (Cosign Needed Addendum)
Subjective:   Julia [name] is a 72 y.o. female who presents for Medicare Annual (Subsequent) preventive examination.  I connected with  Lizzie Prudhomme-Robinson on 06/08/22 by a audio enabled telemedicine application and verified that I am speaking with the correct person using two identifiers.  Patient Location: Home  Provider Location: Home Office  I discussed the limitations of evaluation and management by telemedicine. The patient expressed understanding and agreed to proceed.      Review of Systems     Cardiac Risk Factors include: advanced age (>21men, >12 women);dyslipidemia;hypertension     Objective:    Today's Vitals   06/08/22 1430  Weight: 195 lb (88.5 kg)  Height:  (1.753 m)   Body mass index is 28.8 kg/m.     06/08/2022    2:38 PM 06/05/2021    1:04 PM  Advanced Directives  Does Patient Have a Medical Advance Directive? No No  Would patient like information on creating a medical advance directive? No - Patient declined No - Patient declined    Current Medications (verified) Outpatient Encounter Medications as of 06/08/2022  Medication Sig   acetaminophen (TYLENOL) 500 MG tablet Take 500 mg by mouth at bedtime as needed. As needed at bedtime   amLODipine (NORVASC) 10 MG tablet Take 1 tablet (10 mg total) by mouth daily.   ASPIRIN 81 PO Take 81 mg by mouth daily in the afternoon.   atorvastatin (LIPITOR) 40 MG tablet Take 1 tablet (40 mg total) by mouth daily.   BIOTIN PO Take 1 capsule by mouth daily in the afternoon.   bisoprolol (ZEBETA) 5 MG tablet Take 1 tablet by mouth once daily   CALCIUM PO Take 1 capsule by mouth daily in the afternoon.   Cholecalciferol (VITAMIN D3) 50 MCG (2000 UT) capsule Take 1 capsule (2,000 Units total) by mouth daily.   Ferrous Gluconate (IRON 27 PO) Take by mouth.   Flaxseed, Linseed, (FLAXSEED OIL) 1000 MG CAPS Take 1 capsule by mouth daily in the afternoon.   GARLIC PO Take 1 capsule by mouth daily in  the afternoon.   methocarbamol (ROBAXIN) 500 MG tablet Take 1 tablet (500 mg total) by mouth at bedtime as needed for muscle spasms.   Moringa 500 MG CAPS Take 1 capsule by mouth daily.   Multiple Vitamins-Minerals (CENTRUM SILVER PO) Take 1 tablet by mouth daily in the afternoon.   naproxen sodium (ALEVE) 220 MG tablet Take 220 mg by mouth daily as needed. Per patient takes in AM   Omega-3 Fatty Acids (FISH OIL PO) Take 1 capsule by mouth daily in the afternoon.   omeprazole (PRILOSEC) 20 MG capsule Take 1 capsule (20 mg total) by mouth daily.   TURMERIC PO Take 1 capsule by mouth daily in the afternoon.   Vitamin D-Vitamin K (VITAMIN K2-VITAMIN D3 PO) Take 5,000 Units by mouth daily in the afternoon.   VITAMIN E PO Take 1 capsule by mouth daily in the afternoon.   No facility-administered encounter medications on file as of 06/08/2022.    Allergies (verified) Pravastatin   History: Past Medical History:  Diagnosis Date   Arthritis    GERD (gastroesophageal reflux disease)    Hyperlipidemia    Hypertension    Past Surgical History:  Procedure Laterality Date   BREAST BIOPSY     COLONOSCOPY     [facility] said it was ok   ESOPHAGOGASTRODUODENOSCOPY     2007-[facility] was told that she had a  ulcer   JOINT REPLACEMENT     Both knees   REPLACEMENT TOTAL KNEE BILATERAL     Family History  Problem Relation Age of Onset   Arthritis Mother    Heart disease Mother        Died of "poor circulation"    Hyperlipidemia Mother    Hypertension Mother    Cancer Father    Pancreatic cancer Father    Diabetes Sister    Hypertension Sister    Hypertension Brother    Prostate cancer Brother    Drug abuse Brother    Heart disease Brother    Social History   Socioeconomic History   Marital status: Widowed    Spouse name: Not on file   Number of children: Not on file   Years of education: Not on file   Highest education level: Not on file   Occupational History   Not on file  Tobacco Use   Smoking status: Never   Smokeless tobacco: Never  Vaping Use   Vaping Use: Never used  Substance and Sexual Activity   Alcohol use: Never   Drug use: Never   Sexual activity: Yes  Other Topics Concern   Not on file  Social History Narrative   Three children.  5 grand.     Social Determinants of Health   Financial Resource Strain: Low Risk  (06/05/2021)   Overall Financial Resource Strain (CARDIA)    Difficulty of Paying Living Expenses: Not hard at all  Food Insecurity: No Food Insecurity (06/08/2022)   Hunger Vital Sign    Worried About Running Out of Food in the Last Year: Never true    Ran Out of Food in the Last Year: Never true  Transportation Needs: No Transportation Needs (06/08/2022)   PRAPARE - Administrator, Civil Service (Medical): No    Lack of Transportation (Non-Medical): No  Physical Activity: Sufficiently Active (06/08/2022)   Exercise Vital Sign    Days of Exercise per Week: 5 days    Minutes of Exercise per Session: 40 min  Stress: No Stress Concern Present (06/08/2022)   [employer] of Occupational Health - Occupational Stress Questionnaire    Feeling of Stress : Not at all  Social Connections: Unknown (06/08/2022)   Social Connection and Isolation Panel [NHANES]    Frequency of Communication with Friends and Family: More than three times a week    Frequency of Social Gatherings with Friends and Family: Twice a week    Attends Religious Services: Not on Insurance claims handler of Clubs or Organizations: Yes    Attends Banker Meetings: More than 4 times per year    Marital Status: Widowed    Tobacco Counseling Counseling given: Not Answered   Clinical Intake:  Pre-visit preparation completed: Yes  Pain : No/denies pain     BMI - recorded: 28.8 Nutritional Status: BMI 25 -29 Overweight  How often do you need to have someone help you when you read instructions,  pamphlets, or other written materials from your doctor or pharmacy?: (P) 1 - Never  Diabetic? No     Information entered by :: Kandis Cocking, CMA   Activities of Daily Living    06/08/2022    2:40 PM 06/07/2022   12:47 PM  In your present state of health, do you have any difficulty performing the following activities:  Hearing? 0 0  Vision? 0 0  Difficulty concentrating or making decisions? 0 0  Walking or climbing stairs? 0 0  Dressing or bathing? 0 0  Doing errands, shopping? 0 0  Preparing Food and eating ? N N  Using the Toilet? N N  In the past six months, have you accidently leaked urine? N N  Do you have problems with loss of bowel control? N N  Managing your Medications? N N  Managing your Finances? N N  Housekeeping or managing your Housekeeping? N N    Patient Care Team: Plotnikov, Georgina Quint, MD as PCP - General (Internal Medicine)  Indicate any recent Medical Services you may have received from other than [facility] providers in the past year (date may be approximate).     Assessment:   This is a routine wellness examination for [facility].  Hearing/Vision screen Hearing Screening - Comments:: Denies hearing difficulties   Vision Screening - Comments:: Wears rx glasses - up to date with routine eye exams with  [facility] in Dec 2023  Dietary issues and exercise activities discussed: Current Exercise Habits: Home exercise routine (gym), Type of exercise: walking;Other - see comments (pool, steam room at the gy,m), Time (Minutes): 60, Frequency (Times/Week): 5, Weekly Exercise (Minutes/Week): 300, Intensity: Mild   Goals Addressed             This Visit's Progress    My goal is to stay fit and get more motivated.   On track    Eating better, being with family       Depression Screen    06/08/2022    2:47 PM 05/11/2022   10:45 AM 06/05/2021    1:08 PM 05/06/2021    1:43 PM 11/05/2020    1:55 PM 09/08/2018    9:22 AM  PHQ 2/9 Scores  PHQ - 2 Score 0 0 0  0 0 0  PHQ- 9 Score     0     Fall Risk    06/08/2022    2:33 PM 06/07/2022   12:47 PM 05/11/2022   10:45 AM 06/05/2021    1:05 PM 05/06/2021    1:43 PM  Fall Risk   Falls in the past year? 0 0 0 0 0  Number falls in past yr: 0  0 0   Injury with Fall? 0 0 0 0   Risk for fall due to :   No Fall Risks No Fall Risks   Follow up   Falls evaluation completed Falls evaluation completed     FALL RISK PREVENTION PERTAINING TO THE HOME:  Any stairs in or around the home? Yes If so, are there any without handrails? No  Home free of loose throw rugs in walkways, pet beds, electrical cords, etc? Yes  Adequate lighting in your home to reduce risk of falls? Yes   ASSISTIVE DEVICES UTILIZED TO PREVENT FALLS:  Life alert? No  Use of a cane, walker or w/c? No  Grab bars in the bathroom? Yes  Shower chair or bench in shower? No  Elevated toilet seat or a handicapped toilet? Yes   TIMED UP AND GO:  Was the test performed? No . Televisit   Cognitive Function:        06/08/2022    2:43 PM 06/05/2021    1:17 PM  6CIT Screen  What Year? 0 points 0 points  What month? 0 points 0 points  What time? 0 points 0 points  Count back from 20 0 points 0 points  Months in reverse 0 points 0 points  Repeat phrase  2 points 0 points  Total Score 2 points 0 points    Immunizations Immunization History  Administered Date(s) Administered   Fluad Quad(high Dose 65+) 10/25/2019, 10/22/2020, 10/11/2021   Moderna SARS-COV2 Booster Vaccination 12/18/2019, 05/24/2020   Moderna Sars-Covid-2 Vaccination 04/07/2019, 05/05/2019   PNEUMOCOCCAL CONJUGATE-20 11/05/2020   Pfizer Covid-19 Vaccine Bivalent Booster 85yrs & up 11/17/2020   Pneumococcal Conjugate-13 09/07/2017   Respiratory Syncytial Virus Vaccine,Recomb Aduvanted(Arexvy) 10/06/2021   Tdap 09/07/2017   Zoster Recombinat (Shingrix) 11/23/2017, 02/05/2018    TDAP status: Up to date  Flu Vaccine status: Up to date  Pneumococcal vaccine  status: Up to date  Covid-19 vaccine status: Completed vaccines  Qualifies for Shingles Vaccine? Yes Zostavax completed No   Shingrix Completed?: Yes  Screening Tests Health Maintenance  Topic Date Due   DEXA SCAN  Never done   COVID-19 Vaccine (6 - 2023-24 season) 10/24/2021   INFLUENZA VACCINE  09/24/2022   Medicare Annual Wellness (AWV)  06/08/2023   MAMMOGRAM  11/21/2023   COLONOSCOPY (Pts 45-52yrs Insurance coverage will need to be confirmed)  11/19/2024   DTaP/Tdap/Td (2 - Td or Tdap) 09/08/2027   Pneumonia Vaccine 67+ Years old  Completed   Hepatitis C Screening  Completed   Zoster Vaccines- Shingrix  Completed   HPV VACCINES  Aged Out    Health Maintenance  Health Maintenance Due  Topic Date Due   DEXA SCAN  Never done   COVID-19 Vaccine (6 - 2023-24 season) 10/24/2021    Colorectal cancer screening: Type of screening: Colonoscopy. Completed 11/20/2014. Repeat every 10 years  Mammogram status: Completed 09/28/233. Repeat every year  Bone Density status: Ordered  . Pt provided with contact info and advised to call to schedule appt.  Lung Cancer Screening: (Low Dose CT Chest recommended if Age 40-80 years, 30 pack-year currently smoking OR have quit w/in 15years.) does not qualify.   Lung Cancer Screening Referral:  N/A  Additional Screening:  Hepatitis C Screening: does qualify; Completed 02/04/22  Vision Screening: Recommended annual ophthalmology exams for early detection of glaucoma and other disorders of the eye. Is the patient up to date with their annual eye exam?  Yes  Who is the provider or what is the name of the office in which the patient attends annual eye exams? [facility] If pt is not established with a provider, would they like to be referred to a provider to establish care? No .   Dental Screening: Recommended annual dental exams for proper oral hygiene  Community Resource Referral / Chronic Care Management: CRR required this  visit?  No   CCM required this visit?  No      Plan:     I have personally reviewed and noted the following in the patient's chart:   Medical and social history Use of alcohol, tobacco or illicit drugs  Current medications and supplements including opioid prescriptions. Patient is not currently taking opioid prescriptions. Functional ability and status Nutritional status Physical activity Advanced directives List of other physicians Hospitalizations, surgeries, and ER visits in previous 12 months Vitals Screenings to include cognitive, depression, and falls Referrals and appointments  In addition, I have reviewed and discussed with patient certain preventive protocols, quality metrics, and best practice recommendations. A written personalized care plan for preventive services as well as general preventive health recommendations were provided to patient.     Milus Mallick, CMA   06/08/2022   Nurse Notes: Per patient last bone density exam completed in [place] in 2007,  requested to have bone density complete/  Telephone message to be sent to provider for approval   Medical screening examination/treatment/procedure(s) were performed by non-physician practitioner and as supervising physician I was immediately available for consultation/collaboration.  I agree with above. Jacinta Shoe, MD

## 2022-06-08 NOTE — Patient Instructions (Signed)
[name] , Thank you for taking time to come for your Medicare Wellness Visit. I appreciate your ongoing commitment to your health goals. Please review the following plan we discussed and let me know if I can assist you in the future.   These are the goals we discussed:  Goals      My goal is to stay fit and get more motivated.     Eating better, being with family        This is a list of the screening recommended for you and due dates:  Health Maintenance  Topic Date Due   DEXA scan (bone density measurement)  Never done   COVID-19 Vaccine (7 - 2023-24 season) 07/22/2022   Flu Shot  09/24/2022   Medicare Annual Wellness Visit  06/08/2023   Mammogram  11/21/2023   Colon Cancer Screening  11/19/2024   DTaP/Tdap/Td vaccine (2 - Td or Tdap) 09/08/2027   Pneumonia Vaccine  Completed   Hepatitis C Screening: USPSTF Recommendation to screen - Ages 66-79 yo.  Completed   Zoster (Shingles) Vaccine  Completed   HPV Vaccine  Aged Out    Advanced directives:   Will pick up copy from office  Conditions/risks identified: Keep up the good work  Next appointment: Follow up in one year for your annual wellness visit    Preventive Care 65 Years and Older, Female Preventive care refers to lifestyle choices and visits with your health care provider that can promote health and wellness. What does preventive care include? A yearly physical exam. This is also called an annual well check. Dental exams once or twice a year. Routine eye exams. Ask your health care provider how often you should have your eyes checked. Personal lifestyle choices, including: Daily care of your teeth and gums. Regular physical activity. Eating a healthy diet. Avoiding tobacco and drug use. Limiting alcohol use. Practicing safe sex. Taking low-dose aspirin every day. Taking vitamin and mineral supplements as recommended by your health care provider. What happens during an annual well check? The  services and screenings done by your health care provider during your annual well check will depend on your age, overall health, lifestyle risk factors, and family history of disease. Counseling  Your health care provider may ask you questions about your: Alcohol use. Tobacco use. Drug use. Emotional well-being. Home and relationship well-being. Sexual activity. Eating habits. History of falls. Memory and ability to understand (cognition). Work and work Astronomer. Reproductive health. Screening  You may have the following tests or measurements: Height, weight, and BMI. Blood pressure. Lipid and cholesterol levels. These may be checked every 5 years, or more frequently if you are over 40 years old. Skin check. Lung cancer screening. You may have this screening every year starting at age 15 if you have a 30-pack-year history of smoking and currently smoke or have quit within the past 15 years. Fecal occult blood test (FOBT) of the stool. You may have this test every year starting at age 38. Flexible sigmoidoscopy or colonoscopy. You may have a sigmoidoscopy every 5 years or a colonoscopy every 10 years starting at age 68. Hepatitis C blood test. Hepatitis B blood test. Sexually transmitted disease (STD) testing. Diabetes screening. This is done by checking your blood sugar (glucose) after you have not eaten for a while (fasting). You may have this done every 1-3 years. Bone density scan. This is done to screen for osteoporosis. You may have this done starting at age 66. Mammogram. This may  be done every 1-2 years. Talk to your health care provider about how often you should have regular mammograms. Talk with your health care provider about your test results, treatment options, and if necessary, the need for more tests. Vaccines  Your health care provider may recommend certain vaccines, such as: Influenza vaccine. This is recommended every year. Tetanus, diphtheria, and acellular  pertussis (Tdap, Td) vaccine. You may need a Td booster every 10 years. Zoster vaccine. You may need this after age 37. Pneumococcal 13-valent conjugate (PCV13) vaccine. One dose is recommended after age 58. Pneumococcal polysaccharide (PPSV23) vaccine. One dose is recommended after age 8. Talk to your health care provider about which screenings and vaccines you need and how often you need them. This information is not intended to replace advice given to you by your health care provider. Make sure you discuss any questions you have with your health care provider. Document Released: 03/08/2015 Document Revised: 10/30/2015 Document Reviewed: 12/11/2014 Elsevier Interactive Patient Education  2017 [place] Prevention in the Home Falls can cause injuries. They can happen to people of all ages. There are many things you can do to make your home safe and to help prevent falls. What can I do on the outside of my home? Regularly fix the edges of walkways and driveways and fix any cracks. Remove anything that might make you trip as you walk through a door, such as a raised step or threshold. Trim any bushes or trees on the path to your home. Use bright outdoor lighting. Clear any walking paths of anything that might make someone trip, such as rocks or tools. Regularly check to see if handrails are loose or broken. Make sure that both sides of any steps have handrails. Any raised decks and porches should have guardrails on the edges. Have any leaves, snow, or ice cleared regularly. Use sand or salt on walking paths during winter. Clean up any spills in your garage right away. This includes oil or grease spills. What can I do in the bathroom? Use night lights. Install grab bars by the toilet and in the tub and shower. Do not use towel bars as grab bars. Use non-skid mats or decals in the tub or shower. If you need to sit down in the shower, use a plastic, non-slip stool. Keep the floor  dry. Clean up any water that spills on the floor as soon as it happens. Remove soap buildup in the tub or shower regularly. Attach bath mats securely with double-sided non-slip rug tape. Do not have throw rugs and other things on the floor that can make you trip. What can I do in the bedroom? Use night lights. Make sure that you have a light by your bed that is easy to reach. Do not use any sheets or blankets that are too big for your bed. They should not hang down onto the floor. Have a firm chair that has side arms. You can use this for support while you get dressed. Do not have throw rugs and other things on the floor that can make you trip. What can I do in the kitchen? Clean up any spills right away. Avoid walking on wet floors. Keep items that you use a lot in easy-to-reach places. If you need to reach something above you, use a strong step stool that has a grab bar. Keep electrical cords out of the way. Do not use floor polish or wax that makes floors slippery. If you must use  wax, use non-skid floor wax. Do not have throw rugs and other things on the floor that can make you trip. What can I do with my stairs? Do not leave any items on the stairs. Make sure that there are handrails on both sides of the stairs and use them. Fix handrails that are broken or loose. Make sure that handrails are as long as the stairways. Check any carpeting to make sure that it is firmly attached to the stairs. Fix any carpet that is loose or worn. Avoid having throw rugs at the top or bottom of the stairs. If you do have throw rugs, attach them to the floor with carpet tape. Make sure that you have a light switch at the top of the stairs and the bottom of the stairs. If you do not have them, ask someone to add them for you. What else can I do to help prevent falls? Wear shoes that: Do not have high heels. Have rubber bottoms. Are comfortable and fit you well. Are closed at the toe. Do not wear  sandals. If you use a stepladder: Make sure that it is fully opened. Do not climb a closed stepladder. Make sure that both sides of the stepladder are locked into place. Ask someone to hold it for you, if possible. Clearly mark and make sure that you can see: Any grab bars or handrails. First and last steps. Where the edge of each step is. Use tools that help you move around (mobility aids) if they are needed. These include: Canes. Walkers. Scooters. Crutches. Turn on the lights when you go into a dark area. Replace any light bulbs as soon as they burn out. Set up your furniture so you have a clear path. Avoid moving your furniture around. If any of your floors are uneven, fix them. If there are any pets around you, be aware of where they are. Review your medicines with your doctor. Some medicines can make you feel dizzy. This can increase your chance of falling. Ask your doctor what other things that you can do to help prevent falls. This information is not intended to replace advice given to you by your health care provider. Make sure you discuss any questions you have with your health care provider. Document Released: 12/06/2008 Document Revised: 07/18/2015 Document Reviewed: 03/16/2014 Elsevier Interactive Patient Education  2017 [employer].

## 2022-06-08 NOTE — Addendum Note (Signed)
Addended by: Milus Mallick on: 06/08/2022 05:47 PM   Modules accepted: Orders

## 2022-06-09 ENCOUNTER — Telehealth: Payer: Self-pay

## 2022-06-09 DIAGNOSIS — Z780 Asymptomatic menopausal state: Secondary | ICD-10-CM

## 2022-06-09 NOTE — Telephone Encounter (Addendum)
Patient had AWV on 06/08/22.    She stated that her last Dexa scan was completed in 2007 in [place].    Patient asked for an order to be placed for a repeat Dexa scan     Please advise if okay to place order.

## 2022-06-10 NOTE — Telephone Encounter (Signed)
Done. Thanks.

## 2022-06-23 ENCOUNTER — Ambulatory Visit (INDEPENDENT_AMBULATORY_CARE_PROVIDER_SITE_OTHER): Payer: Medicare HMO

## 2022-06-23 ENCOUNTER — Ambulatory Visit (INDEPENDENT_AMBULATORY_CARE_PROVIDER_SITE_OTHER): Payer: Medicare HMO | Admitting: Internal Medicine

## 2022-06-23 ENCOUNTER — Encounter: Payer: Self-pay | Admitting: Internal Medicine

## 2022-06-23 VITALS — BP 102/68 | HR 53 | Temp 98.4°F | Ht 69.0 in | Wt 197.0 lb

## 2022-06-23 DIAGNOSIS — I10 Essential (primary) hypertension: Secondary | ICD-10-CM | POA: Diagnosis not present

## 2022-06-23 DIAGNOSIS — I2510 Atherosclerotic heart disease of native coronary artery without angina pectoris: Secondary | ICD-10-CM

## 2022-06-23 DIAGNOSIS — R0789 Other chest pain: Secondary | ICD-10-CM | POA: Insufficient documentation

## 2022-06-23 DIAGNOSIS — I2583 Coronary atherosclerosis due to lipid rich plaque: Secondary | ICD-10-CM

## 2022-06-23 DIAGNOSIS — R0781 Pleurodynia: Secondary | ICD-10-CM | POA: Diagnosis not present

## 2022-06-23 DIAGNOSIS — E785 Hyperlipidemia, unspecified: Secondary | ICD-10-CM

## 2022-06-23 DIAGNOSIS — R109 Unspecified abdominal pain: Secondary | ICD-10-CM

## 2022-06-23 LAB — URINALYSIS
Bilirubin Urine: NEGATIVE
Hgb urine dipstick: NEGATIVE
Ketones, ur: NEGATIVE
Leukocytes,Ua: NEGATIVE
Nitrite: NEGATIVE
Specific Gravity, Urine: 1.015 (ref 1.000–1.030)
Total Protein, Urine: NEGATIVE
Urine Glucose: NEGATIVE
Urobilinogen, UA: 1 (ref 0.0–1.0)
pH: 7 (ref 5.0–8.0)

## 2022-06-23 NOTE — Progress Notes (Signed)
Subjective:  Patient ID: Laresha Bacorn, female    DOB: [birth year]  Age: 72 y.o. MRN: [id number]  CC: Follow-up (6 week f/u)   HPI Dinia Prudhomme-Robinson presents for arthralgias C/o R flank pain - abd CT, RUQ [place] were normal in Dec 2024. Pain is back - 5/10, constant... Leg pain/cramps resolved off the Lipitor  Outpatient Medications Prior to Visit  Medication Sig Dispense Refill   acetaminophen (TYLENOL) 500 MG tablet Take 500 mg by mouth at bedtime as needed. As needed at bedtime     amLODipine (NORVASC) 10 MG tablet Take 1 tablet (10 mg total) by mouth daily. 90 tablet 1   ASPIRIN 81 PO Take 81 mg by mouth daily in the afternoon.     atorvastatin (LIPITOR) 40 MG tablet Take 1 tablet (40 mg total) by mouth daily. 90 tablet 1   BIOTIN PO Take 1 capsule by mouth daily in the afternoon.     bisoprolol (ZEBETA) 5 MG tablet Take 1 tablet by mouth once daily 90 tablet 2   CALCIUM PO Take 1 capsule by mouth daily in the afternoon.     Cholecalciferol (VITAMIN D3) 50 MCG (2000 UT) capsule Take 1 capsule (2,000 Units total) by mouth daily. 100 capsule 3   Ferrous Gluconate (IRON 27 PO) Take by mouth.     Flaxseed, Linseed, (FLAXSEED OIL) 1000 MG CAPS Take 1 capsule by mouth daily in the afternoon.     GARLIC PO Take 1 capsule by mouth daily in the afternoon.     methocarbamol (ROBAXIN) 500 MG tablet Take 1 tablet (500 mg total) by mouth at bedtime as needed for muscle spasms. 20 tablet 0   Moringa 500 MG CAPS Take 1 capsule by mouth daily.     Multiple Vitamins-Minerals (CENTRUM SILVER PO) Take 1 tablet by mouth daily in the afternoon.     naproxen sodium (ALEVE) 220 MG tablet Take 220 mg by mouth daily as needed. Per patient takes in AM     Omega-3 Fatty Acids (FISH OIL PO) Take 1 capsule by mouth daily in the afternoon.     omeprazole (PRILOSEC) 20 MG capsule Take 1 capsule (20 mg total) by mouth daily. 30 capsule 0   TURMERIC PO Take 1 capsule by mouth daily in the afternoon.      Vitamin D-Vitamin K (VITAMIN K2-VITAMIN D3 PO) Take 5,000 Units by mouth daily in the afternoon.     VITAMIN E PO Take 1 capsule by mouth daily in the afternoon.     No facility-administered medications prior to visit.    ROS: Review of Systems  Constitutional:  Negative for activity change, appetite change, chills, fatigue and unexpected weight change.  HENT:  Negative for congestion, mouth sores and sinus pressure.   Eyes:  Negative for visual disturbance.  Respiratory:  Negative for cough and chest tightness.   Gastrointestinal:  Negative for abdominal pain and nausea.  Genitourinary:  Negative for difficulty urinating, frequency and vaginal pain.  Musculoskeletal:  Positive for arthralgias. Negative for back pain and gait problem.  Skin:  Negative for pallor and rash.  Neurological:  Negative for dizziness, tremors, weakness, numbness and headaches.  Psychiatric/Behavioral:  Negative for confusion and sleep disturbance.     Objective:  BP 102/68 (BP Location: Left Arm, Patient Position: Sitting, Cuff Size: Normal)   Pulse (!) 53   Temp 98.4 F (36.9 C) (Oral)   Ht 5\' 9"  (1.753 m)   Wt 197 lb (89.4 kg)   SpO2  98%   BMI 29.09 kg/m   BP Readings from Last 3 Encounters:  06/23/22 102/68  05/11/22 110/78  03/10/22 130/70    Wt Readings from Last 3 Encounters:  06/23/22 197 lb (89.4 kg)  06/08/22 195 lb (88.5 kg)  05/11/22 192 lb (87.1 kg)    Physical Exam Constitutional:      General: She is not in acute distress.    Appearance: She is well-developed. She is obese.  HENT:     Head: Normocephalic.     Right Ear: External ear normal.     Left Ear: External ear normal.     Nose: Nose normal.  Eyes:     General:        Right eye: No discharge.        Left eye: No discharge.     Conjunctiva/sclera: Conjunctivae normal.     Pupils: Pupils are equal, round, and reactive to light.  Neck:     Thyroid: No thyromegaly.     Vascular: No JVD.     Trachea: No tracheal  deviation.  Cardiovascular:     Rate and Rhythm: Normal rate and regular rhythm.     Heart sounds: Normal heart sounds.  Pulmonary:     Effort: No respiratory distress.     Breath sounds: No stridor. No wheezing.  Abdominal:     General: Bowel sounds are normal. There is no distension.     Palpations: Abdomen is soft. There is no mass.     Tenderness: There is no abdominal tenderness. There is no guarding or rebound.  Musculoskeletal:        General: No tenderness.     Cervical back: Normal range of motion and neck supple. No rigidity.  Lymphadenopathy:     Cervical: No cervical adenopathy.  Skin:    Findings: No erythema or rash.  Neurological:     Cranial Nerves: No cranial nerve deficit.     Motor: No abnormal muscle tone.     Coordination: Coordination normal.     Deep Tendon Reflexes: Reflexes normal.  Psychiatric:        Behavior: Behavior normal.        Thought Content: Thought content normal.        Judgment: Judgment normal.    R lower ribs are tender  Lab Results  Component Value Date   WBC 3.3 (L) 05/12/2022   HGB 14.2 05/12/2022   HCT 42.0 05/12/2022   PLT 184.0 05/12/2022   GLUCOSE 75 05/12/2022   CHOL 204 (H) 05/12/2022   TRIG 104.0 05/12/2022   HDL 72.50 05/12/2022   LDLCALC 110 (H) 05/12/2022   ALT 62 (H) 05/12/2022   AST 73 (H) 05/12/2022   NA 138 05/12/2022   K 4.0 05/12/2022   CL 103 05/12/2022   CREATININE 0.96 05/12/2022   BUN 15 05/12/2022   CO2 24 05/12/2022   TSH 0.78 05/12/2022    US Abdomen Limited RUQ (LIVER/GB)  Result Date: 02/04/2022 CLINICAL DATA:  Transaminitis EXAM: ULTRASOUND ABDOMEN LIMITED RIGHT UPPER QUADRANT COMPARISON:  02/04/2022 FINDINGS: Gallbladder: No gallstones or wall thickening visualized. No sonographic Murphy sign noted by sonographer. Common bile duct: Diameter: 3 mm Liver: No focal lesion identified. Within normal limits in parenchymal echogenicity. Portal vein is patent on color Doppler imaging with normal  direction of blood flow towards the liver. Other: None. IMPRESSION: 1. Unremarkable right upper quadrant ultrasound. Electronically Signed   By: Sharlet Salina M.D.   On: 02/04/2022 22:28  CT ABDOMEN PELVIS W CONTRAST  Result Date: 02/04/2022 CLINICAL DATA:  Right-sided flank pain for 1 week, initial encounter EXAM: CT ABDOMEN AND PELVIS WITH CONTRAST TECHNIQUE: Multidetector CT imaging of the abdomen and pelvis was performed using the standard protocol following bolus administration of intravenous contrast. RADIATION DOSE REDUCTION: This exam was performed according to the departmental dose-optimization program which includes automated exposure control, adjustment of the mA and/or kV according to patient size and/or use of iterative reconstruction technique. CONTRAST:  OMNIPAQUE IOHEXOL 300 MG/ML  SOLN COMPARISON:  None Available. FINDINGS: Lower chest: No acute abnormality. Hepatobiliary: No focal liver abnormality is seen. No gallstones, gallbladder wall thickening, or biliary dilatation. Pancreas: Unremarkable. No pancreatic ductal dilatation or surrounding inflammatory changes. Spleen: Normal in size without focal abnormality. Adrenals/Urinary Tract: Adrenal glands are within normal limits. Kidneys demonstrate a normal enhancement pattern bilaterally. No renal calculi or obstructive changes are seen. The bladder is well distended. Stomach/Bowel: No obstructive or inflammatory changes of the colon are noted. The appendix is within normal limits. Small bowel and stomach are unremarkable with the exception of a moderate to large sliding-type hiatal hernia. Vascular/Lymphatic: Aortic atherosclerosis. No enlarged abdominal or pelvic lymph nodes. Reproductive: Status post hysterectomy. No adnexal masses. Other: No abdominal wall hernia or abnormality. No abdominopelvic ascites. Musculoskeletal: Degenerative changes of lumbar spine are noted. Mild anterolisthesis of L3 on L4 and L4 on L5 is noted.  IMPRESSION: Moderate to large hiatal hernia. No acute abnormality is noted. Electronically Signed   By: Alcide Clever M.D.   On: 02/04/2022 21:19    Assessment & Plan:   Problem List Items Addressed This Visit     HTN (hypertension)    Cont on Bisoprolol, Amlodipine      Dyslipidemia     Re-start on Lipitor - qod Leg pain/cramps resolved off the Lipitor      Coronary atherosclerosis    Re-start on Lipitor - qod Leg pain/cramps resolved off the Lipitor      Flank pain    Chronic R flank pain - abd CT, RUQ [place] were normal in Dec 2024. Pain is back - 5/10, constant... Leg pain/cramps resolved off the Lipitor R lower ribs are tender - X ray Blue-Emu cream was recommended to use 2-3 times a day      Relevant Orders   Urinalysis   Chest wall pain - Primary    R flank pain - abd CT, RUQ [place] were normal in Dec 2024. Pain is back - 5/10, constant... Leg pain/cramps resolved off the Lipitor R lower ribs are tender - X ray Blue-Emu cream was recommended to use 2-3 times a day       Relevant Orders   DG Ribs Unilateral Right      No orders of the defined types were placed in this encounter.     Follow-up: No follow-ups on file.  Sonda Primes, MD

## 2022-06-23 NOTE — Assessment & Plan Note (Signed)
Re-start on Lipitor - qod Leg pain/cramps resolved off the Lipitor

## 2022-06-23 NOTE — Assessment & Plan Note (Signed)
Cont on Bisoprolol, Amlodipine 

## 2022-06-23 NOTE — Assessment & Plan Note (Addendum)
Chronic R flank pain - abd CT, RUQ [place] were normal in Dec 2024. Pain is back - 5/10, constant... Leg pain/cramps resolved off the Lipitor R lower ribs are tender - X ray Blue-Emu cream was recommended to use 2-3 times a day

## 2022-06-23 NOTE — Assessment & Plan Note (Addendum)
  Re-start on Lipitor - qod Leg pain/cramps resolved off the Lipitor

## 2022-06-23 NOTE — Assessment & Plan Note (Signed)
R flank pain - abd CT, RUQ [place] were normal in Dec 2024. Pain is back - 5/10, constant... Leg pain/cramps resolved off the Lipitor R lower ribs are tender - X ray Blue-Emu cream was recommended to use 2-3 times a day

## 2022-08-20 ENCOUNTER — Other Ambulatory Visit: Payer: Self-pay | Admitting: Internal Medicine

## 2022-09-11 DIAGNOSIS — Z8249 Family history of ischemic heart disease and other diseases of the circulatory system: Secondary | ICD-10-CM | POA: Diagnosis not present

## 2022-09-11 DIAGNOSIS — Z809 Family history of malignant neoplasm, unspecified: Secondary | ICD-10-CM | POA: Diagnosis not present

## 2022-09-11 DIAGNOSIS — Z7982 Long term (current) use of aspirin: Secondary | ICD-10-CM | POA: Diagnosis not present

## 2022-09-11 DIAGNOSIS — M1990 Unspecified osteoarthritis, unspecified site: Secondary | ICD-10-CM | POA: Diagnosis not present

## 2022-09-11 DIAGNOSIS — E785 Hyperlipidemia, unspecified: Secondary | ICD-10-CM | POA: Diagnosis not present

## 2022-09-11 DIAGNOSIS — I10 Essential (primary) hypertension: Secondary | ICD-10-CM | POA: Diagnosis not present

## 2022-09-11 DIAGNOSIS — Z791 Long term (current) use of non-steroidal anti-inflammatories (NSAID): Secondary | ICD-10-CM | POA: Diagnosis not present

## 2022-09-22 ENCOUNTER — Ambulatory Visit (INDEPENDENT_AMBULATORY_CARE_PROVIDER_SITE_OTHER): Payer: Medicare HMO | Admitting: Internal Medicine

## 2022-09-22 ENCOUNTER — Encounter: Payer: Self-pay | Admitting: Internal Medicine

## 2022-09-22 VITALS — BP 110/80 | HR 78 | Temp 98.6°F | Ht 69.0 in | Wt 196.0 lb

## 2022-09-22 DIAGNOSIS — R109 Unspecified abdominal pain: Secondary | ICD-10-CM

## 2022-09-22 DIAGNOSIS — I10 Essential (primary) hypertension: Secondary | ICD-10-CM

## 2022-09-22 DIAGNOSIS — Z780 Asymptomatic menopausal state: Secondary | ICD-10-CM

## 2022-09-22 DIAGNOSIS — R7989 Other specified abnormal findings of blood chemistry: Secondary | ICD-10-CM

## 2022-09-22 DIAGNOSIS — R748 Abnormal levels of other serum enzymes: Secondary | ICD-10-CM | POA: Diagnosis not present

## 2022-09-22 DIAGNOSIS — I2583 Coronary atherosclerosis due to lipid rich plaque: Secondary | ICD-10-CM | POA: Diagnosis not present

## 2022-09-22 DIAGNOSIS — E785 Hyperlipidemia, unspecified: Secondary | ICD-10-CM

## 2022-09-22 DIAGNOSIS — N289 Disorder of kidney and ureter, unspecified: Secondary | ICD-10-CM | POA: Diagnosis not present

## 2022-09-22 DIAGNOSIS — I2510 Atherosclerotic heart disease of native coronary artery without angina pectoris: Secondary | ICD-10-CM | POA: Diagnosis not present

## 2022-09-22 NOTE — Assessment & Plan Note (Signed)
She did have a calcium score of 38

## 2022-09-22 NOTE — Progress Notes (Signed)
Subjective:  Patient ID: Julia [name], female    DOB: [birth year]  Age: 72 y.o. MRN: [id number]  CC: Follow-up (3 mnth f/u, discuss getting bone density scan and pain in rt flank area )   HPI Julia [name] presents for R flank pain - not better, HTN, menopause  Outpatient Medications Prior to Visit  Medication Sig Dispense Refill   acetaminophen (TYLENOL) 500 MG tablet Take 500 mg by mouth at bedtime as needed. As needed at bedtime     amLODipine (NORVASC) 10 MG tablet Take 1 tablet by mouth once daily 90 tablet 3   ASPIRIN 81 PO Take 81 mg by mouth daily in the afternoon.     atorvastatin (LIPITOR) 40 MG tablet Take 1 tablet by mouth once daily 90 tablet 3   BIOTIN PO Take 1 capsule by mouth daily in the afternoon.     bisoprolol (ZEBETA) 5 MG tablet Take 1 tablet by mouth once daily 90 tablet 2   CALCIUM PO Take 1 capsule by mouth daily in the afternoon.     Cholecalciferol (VITAMIN D3) 50 MCG (2000 UT) capsule Take 1 capsule (2,000 Units total) by mouth daily. 100 capsule 3   Ferrous Gluconate (IRON 27 PO) Take by mouth.     Flaxseed, Linseed, (FLAXSEED OIL) 1000 MG CAPS Take 1 capsule by mouth daily in the afternoon.     GARLIC PO Take 1 capsule by mouth daily in the afternoon.     methocarbamol (ROBAXIN) 500 MG tablet Take 1 tablet (500 mg total) by mouth at bedtime as needed for muscle spasms. 20 tablet 0   Moringa 500 MG CAPS Take 1 capsule by mouth daily.     Multiple Vitamins-Minerals (CENTRUM SILVER PO) Take 1 tablet by mouth daily in the afternoon.     naproxen sodium (ALEVE) 220 MG tablet Take 220 mg by mouth daily as needed. Per patient takes in AM     Omega-3 Fatty Acids (FISH OIL PO) Take 1 capsule by mouth daily in the afternoon.     omeprazole (PRILOSEC) 20 MG capsule Take 1 capsule (20 mg total) by mouth daily. 30 capsule 0   TURMERIC PO Take 1 capsule by mouth daily in the afternoon.     Vitamin D-Vitamin K (VITAMIN K2-VITAMIN D3 PO) Take 5,000  Units by mouth daily in the afternoon.     VITAMIN E PO Take 1 capsule by mouth daily in the afternoon.     No facility-administered medications prior to visit.    ROS: Review of Systems  Constitutional:  Negative for activity change, appetite change, chills, fatigue and unexpected weight change.  HENT:  Negative for congestion, mouth sores and sinus pressure.   Eyes:  Negative for visual disturbance.  Respiratory:  Negative for cough and chest tightness.   Gastrointestinal:  Negative for abdominal pain and nausea.  Genitourinary:  Negative for difficulty urinating, frequency and vaginal pain.  Musculoskeletal:  Negative for back pain and gait problem.  Skin:  Negative for pallor and rash.  Neurological:  Negative for dizziness, tremors, weakness, numbness and headaches.  Psychiatric/Behavioral:  Negative for confusion, sleep disturbance and suicidal ideas.     Objective:  BP 110/80 (BP Location: Left Arm, Patient Position: Sitting, Cuff Size: Normal)   Pulse 78   Temp 98.6 F (37 C) (Oral)   Ht 5\' 9"  (1.753 m)   Wt 196 lb (88.9 kg)   SpO2 98%   BMI 28.94 kg/m   BP Readings from Last 3  Encounters:  09/22/22 110/80  06/23/22 102/68  05/11/22 110/78    Wt Readings from Last 3 Encounters:  09/22/22 196 lb (88.9 kg)  06/23/22 197 lb (89.4 kg)  06/08/22 195 lb (88.5 kg)    Physical Exam Constitutional:      General: She is not in acute distress.    Appearance: She is well-developed.  HENT:     Head: Normocephalic.     Right Ear: External ear normal.     Left Ear: External ear normal.     Nose: Nose normal.  Eyes:     General:        Right eye: No discharge.        Left eye: No discharge.     Conjunctiva/sclera: Conjunctivae normal.     Pupils: Pupils are equal, round, and reactive to light.  Neck:     Thyroid: No thyromegaly.     Vascular: No JVD.     Trachea: No tracheal deviation.  Cardiovascular:     Rate and Rhythm: Normal rate and regular rhythm.      Heart sounds: Normal heart sounds.  Pulmonary:     Effort: No respiratory distress.     Breath sounds: No stridor. No wheezing.  Abdominal:     General: Bowel sounds are normal. There is no distension.     Palpations: Abdomen is soft. There is no mass.     Tenderness: There is no abdominal tenderness. There is no guarding or rebound.  Musculoskeletal:        General: No tenderness.     Cervical back: Normal range of motion and neck supple. No rigidity.  Lymphadenopathy:     Cervical: No cervical adenopathy.  Skin:    Findings: No erythema or rash.  Neurological:     Cranial Nerves: No cranial nerve deficit.     Motor: No abnormal muscle tone.     Coordination: Coordination normal.     Deep Tendon Reflexes: Reflexes normal.  Psychiatric:        Behavior: Behavior normal.        Thought Content: Thought content normal.        Judgment: Judgment normal.     Lab Results  Component Value Date   WBC 3.3 (L) 05/12/2022   HGB 14.2 05/12/2022   HCT 42.0 05/12/2022   PLT 184.0 05/12/2022   GLUCOSE 75 05/12/2022   CHOL 204 (H) 05/12/2022   TRIG 104.0 05/12/2022   HDL 72.50 05/12/2022   LDLCALC 110 (H) 05/12/2022   ALT 62 (H) 05/12/2022   AST 73 (H) 05/12/2022   NA 138 05/12/2022   K 4.0 05/12/2022   CL 103 05/12/2022   CREATININE 0.96 05/12/2022   BUN 15 05/12/2022   CO2 24 05/12/2022   TSH 0.78 05/12/2022    US Abdomen Limited RUQ (LIVER/GB)  Result Date: 02/04/2022 CLINICAL DATA:  Transaminitis EXAM: ULTRASOUND ABDOMEN LIMITED RIGHT UPPER QUADRANT COMPARISON:  02/04/2022 FINDINGS: Gallbladder: No gallstones or wall thickening visualized. No sonographic Murphy sign noted by sonographer. Common bile duct: Diameter: 3 mm Liver: No focal lesion identified. Within normal limits in parenchymal echogenicity. Portal vein is patent on color Doppler imaging with normal direction of blood flow towards the liver. Other: None. IMPRESSION: 1. Unremarkable right upper quadrant ultrasound.  Electronically Signed   By: Sharlet Salina M.D.   On: 02/04/2022 22:28   CT ABDOMEN PELVIS W CONTRAST  Result Date: 02/04/2022 CLINICAL DATA:  Right-sided flank pain for 1 week, initial encounter EXAM:  CT ABDOMEN AND PELVIS WITH CONTRAST TECHNIQUE: Multidetector CT imaging of the abdomen and pelvis was performed using the standard protocol following bolus administration of intravenous contrast. RADIATION DOSE REDUCTION: This exam was performed according to the departmental dose-optimization program which includes automated exposure control, adjustment of the mA and/or kV according to patient size and/or use of iterative reconstruction technique. CONTRAST:  OMNIPAQUE IOHEXOL 300 MG/ML  SOLN COMPARISON:  None Available. FINDINGS: Lower chest: No acute abnormality. Hepatobiliary: No focal liver abnormality is seen. No gallstones, gallbladder wall thickening, or biliary dilatation. Pancreas: Unremarkable. No pancreatic ductal dilatation or surrounding inflammatory changes. Spleen: Normal in size without focal abnormality. Adrenals/Urinary Tract: Adrenal glands are within normal limits. Kidneys demonstrate a normal enhancement pattern bilaterally. No renal calculi or obstructive changes are seen. The bladder is well distended. Stomach/Bowel: No obstructive or inflammatory changes of the colon are noted. The appendix is within normal limits. Small bowel and stomach are unremarkable with the exception of a moderate to large sliding-type hiatal hernia. Vascular/Lymphatic: Aortic atherosclerosis. No enlarged abdominal or pelvic lymph nodes. Reproductive: Status post hysterectomy. No adnexal masses. Other: No abdominal wall hernia or abnormality. No abdominopelvic ascites. Musculoskeletal: Degenerative changes of lumbar spine are noted. Mild anterolisthesis of L3 on L4 and L4 on L5 is noted. IMPRESSION: Moderate to large hiatal hernia. No acute abnormality is noted. Electronically Signed   By: Alcide Clever M.D.    On: 02/04/2022 21:19    Assessment & Plan:   Problem List Items Addressed This Visit     HTN (hypertension)    Cont on Bisoprolol, Amlodipine      Dyslipidemia    On diet      Coronary atherosclerosis     She did have a calcium score of 38      Renal insufficiency    Continue to monitor GFR and to hydrate well Treat HTN      Flank pain - Primary    MSK - Negative work up Blue-Emu cream - use 2-3 times a day Will ref to [facility]      Relevant Orders   Ambulatory referral to Chiropractic   Elevated liver enzymes    Check LFTs      Elevated serum creatinine    Monitor GFR Hydrating well      Other Visit Diagnoses     Postmenopausal estrogen deficiency       Relevant Orders   DG Bone Density         No orders of the defined types were placed in this encounter.     Follow-up: Return in about 4 months (around 01/23/2023) for a follow-up visit.  Sonda Primes, MD

## 2022-09-22 NOTE — Assessment & Plan Note (Signed)
Monitor GFR Hydrating well

## 2022-09-22 NOTE — Assessment & Plan Note (Signed)
Cont on Bisoprolol, Amlodipine 

## 2022-09-22 NOTE — Assessment & Plan Note (Addendum)
MSK - Negative work up Blue-Emu cream - use 2-3 times a day Will ref to [facility]

## 2022-09-22 NOTE — Assessment & Plan Note (Signed)
  On diet  

## 2022-09-22 NOTE — Assessment & Plan Note (Signed)
Check LFTs 

## 2022-09-22 NOTE — Assessment & Plan Note (Signed)
Continue to monitor GFR and to hydrate well Treat HTN 

## 2022-09-24 ENCOUNTER — Ambulatory Visit
Admission: RE | Admit: 2022-09-24 | Discharge: 2022-09-24 | Disposition: A | Discharge status: Home / Self Care | Payer: Medicare HMO | Source: Ambulatory Visit | Attending: Internal Medicine | Admitting: Internal Medicine

## 2022-09-24 DIAGNOSIS — Z780 Asymptomatic menopausal state: Secondary | ICD-10-CM

## 2022-09-30 DIAGNOSIS — M50322 Other cervical disc degeneration at C5-C6 level: Secondary | ICD-10-CM | POA: Diagnosis not present

## 2022-09-30 DIAGNOSIS — M9901 Segmental and somatic dysfunction of cervical region: Secondary | ICD-10-CM | POA: Diagnosis not present

## 2022-09-30 DIAGNOSIS — M5116 Intervertebral disc disorders with radiculopathy, lumbar region: Secondary | ICD-10-CM | POA: Diagnosis not present

## 2022-09-30 DIAGNOSIS — M9903 Segmental and somatic dysfunction of lumbar region: Secondary | ICD-10-CM | POA: Diagnosis not present

## 2022-10-05 DIAGNOSIS — M5116 Intervertebral disc disorders with radiculopathy, lumbar region: Secondary | ICD-10-CM | POA: Diagnosis not present

## 2022-10-05 DIAGNOSIS — M50322 Other cervical disc degeneration at C5-C6 level: Secondary | ICD-10-CM | POA: Diagnosis not present

## 2022-10-05 DIAGNOSIS — M9903 Segmental and somatic dysfunction of lumbar region: Secondary | ICD-10-CM | POA: Diagnosis not present

## 2022-10-05 DIAGNOSIS — M9901 Segmental and somatic dysfunction of cervical region: Secondary | ICD-10-CM | POA: Diagnosis not present

## 2022-10-06 DIAGNOSIS — M50322 Other cervical disc degeneration at C5-C6 level: Secondary | ICD-10-CM | POA: Diagnosis not present

## 2022-10-06 DIAGNOSIS — M9901 Segmental and somatic dysfunction of cervical region: Secondary | ICD-10-CM | POA: Diagnosis not present

## 2022-10-06 DIAGNOSIS — M5116 Intervertebral disc disorders with radiculopathy, lumbar region: Secondary | ICD-10-CM | POA: Diagnosis not present

## 2022-10-06 DIAGNOSIS — M9903 Segmental and somatic dysfunction of lumbar region: Secondary | ICD-10-CM | POA: Diagnosis not present

## 2022-10-14 DIAGNOSIS — M5116 Intervertebral disc disorders with radiculopathy, lumbar region: Secondary | ICD-10-CM | POA: Diagnosis not present

## 2022-10-14 DIAGNOSIS — M9903 Segmental and somatic dysfunction of lumbar region: Secondary | ICD-10-CM | POA: Diagnosis not present

## 2022-10-14 DIAGNOSIS — M50322 Other cervical disc degeneration at C5-C6 level: Secondary | ICD-10-CM | POA: Diagnosis not present

## 2022-10-14 DIAGNOSIS — M9901 Segmental and somatic dysfunction of cervical region: Secondary | ICD-10-CM | POA: Diagnosis not present

## 2022-10-15 ENCOUNTER — Other Ambulatory Visit: Payer: Self-pay | Admitting: Internal Medicine

## 2022-10-15 DIAGNOSIS — Z1231 Encounter for screening mammogram for malignant neoplasm of breast: Secondary | ICD-10-CM

## 2022-10-19 DIAGNOSIS — M9903 Segmental and somatic dysfunction of lumbar region: Secondary | ICD-10-CM | POA: Diagnosis not present

## 2022-10-19 DIAGNOSIS — M50322 Other cervical disc degeneration at C5-C6 level: Secondary | ICD-10-CM | POA: Diagnosis not present

## 2022-10-19 DIAGNOSIS — M5116 Intervertebral disc disorders with radiculopathy, lumbar region: Secondary | ICD-10-CM | POA: Diagnosis not present

## 2022-10-19 DIAGNOSIS — M9901 Segmental and somatic dysfunction of cervical region: Secondary | ICD-10-CM | POA: Diagnosis not present

## 2022-10-21 DIAGNOSIS — M50322 Other cervical disc degeneration at C5-C6 level: Secondary | ICD-10-CM | POA: Diagnosis not present

## 2022-10-21 DIAGNOSIS — M5116 Intervertebral disc disorders with radiculopathy, lumbar region: Secondary | ICD-10-CM | POA: Diagnosis not present

## 2022-10-21 DIAGNOSIS — M9903 Segmental and somatic dysfunction of lumbar region: Secondary | ICD-10-CM | POA: Diagnosis not present

## 2022-10-21 DIAGNOSIS — M9901 Segmental and somatic dysfunction of cervical region: Secondary | ICD-10-CM | POA: Diagnosis not present

## 2022-11-03 DIAGNOSIS — M5116 Intervertebral disc disorders with radiculopathy, lumbar region: Secondary | ICD-10-CM | POA: Diagnosis not present

## 2022-11-03 DIAGNOSIS — M50322 Other cervical disc degeneration at C5-C6 level: Secondary | ICD-10-CM | POA: Diagnosis not present

## 2022-11-03 DIAGNOSIS — M9901 Segmental and somatic dysfunction of cervical region: Secondary | ICD-10-CM | POA: Diagnosis not present

## 2022-11-03 DIAGNOSIS — M9903 Segmental and somatic dysfunction of lumbar region: Secondary | ICD-10-CM | POA: Diagnosis not present

## 2022-11-10 DIAGNOSIS — M9903 Segmental and somatic dysfunction of lumbar region: Secondary | ICD-10-CM | POA: Diagnosis not present

## 2022-11-10 DIAGNOSIS — M9901 Segmental and somatic dysfunction of cervical region: Secondary | ICD-10-CM | POA: Diagnosis not present

## 2022-11-10 DIAGNOSIS — M5116 Intervertebral disc disorders with radiculopathy, lumbar region: Secondary | ICD-10-CM | POA: Diagnosis not present

## 2022-11-10 DIAGNOSIS — M50322 Other cervical disc degeneration at C5-C6 level: Secondary | ICD-10-CM | POA: Diagnosis not present

## 2022-11-10 NOTE — Progress Notes (Unsigned)
Cardiology Office Note:   Date:  11/11/2022  ID:  Julia [name], DOB [birth year], MRN [id number] PCP: Tresa Garter, MD  [facility] Providers Cardiologist:  None {  History of Present Illness:   Julia [name] is a 72 y.o. female  who was referred by Plotnikov, Georgina Quint, MD for evaluation of palpitations.   She has a history of paroxysmal atrial fibrillation with rapid ventricular rate.  She had a perfusion study from 2019 with an EF of 66% and no evidence of ischemia.  I do see a Holter report with some evidence of tachycardia suggestive of atrial tachycardia and also short runs in therapy.  She had a CT angiography of her coronaries with no significant disease.  She did have a calcium score of 38.2.   Since I last saw her she denies any new symptoms.  The patient denies any new symptoms such as chest discomfort, neck or arm discomfort. There has been no new shortness of breath, PND or orthopnea. There have been no reported palpitations, presyncope or syncope.   She swims for exercise.   ROS: As stated in the HPI and negative for all other systems.  Studies Reviewed:    EKG:   EKG Interpretation Date/Time:  Wednesday November 11 2022 11:37:27 EDT Ventricular Rate:  48 PR Interval:  196 QRS Duration:  84 QT Interval:  466 QTC Calculation: 416 R Axis:   18  Text Interpretation: Sinus bradycardia with occasional Premature ventricular complexes Confirmed by Rollene Rotunda ([id number]) on 11/11/2022 12:08:21 PM     Risk Assessment/Calculations:              Physical Exam:   VS:  BP 130/66 (BP Location: Left Arm, Patient Position: Sitting, Cuff Size: Normal)   Pulse (!) 48   Ht 5\' 8"  (1.727 m)   Wt 193 lb (87.5 kg)   SpO2 99%   BMI 29.35 kg/m    Wt Readings from Last 3 Encounters:  11/11/22 193 lb (87.5 kg)  09/22/22 196 lb (88.9 kg)  06/23/22 197 lb (89.4 kg)     GEN: Well nourished, well developed in no acute distress NECK: No JVD; No  carotid bruits CARDIAC: RRR, no murmurs, rubs, gallops RESPIRATORY:  Clear to auscultation without rales, wheezing or rhonchi  ABDOMEN: Soft, non-tender, non-distended EXTREMITIES:  No edema; No deformity   ASSESSMENT AND PLAN:   PALPITATIONS:   She has no significant tachypalpitations.  She can keep an eye out for any supraventricular tachycardia or any hint of atrial fibrillation with 2 wearable watches that she has.  Right now she is not high risk and does not need DOAC.  CORONARY CALCIUM:   She will continue with primary risk reduction.  No change in therapy.   HTN: Her blood pressure is well-controlled.  No change in therapy.     DYSLIPIDEMIA:   LDL was 110 with an HDL of 72.5.  She was just started on statin earlier this year can have this repeated by her primary provider with a goal LDL in the 70s in my opinion.       Follow up with me as needed.   Signed, Rollene Rotunda, MD

## 2022-11-11 ENCOUNTER — Encounter: Payer: Self-pay | Admitting: Cardiology

## 2022-11-11 ENCOUNTER — Ambulatory Visit: Payer: Medicare HMO | Attending: Cardiology | Admitting: Cardiology

## 2022-11-11 VITALS — BP 130/66 | HR 48 | Ht 68.0 in | Wt 193.0 lb

## 2022-11-11 DIAGNOSIS — E785 Hyperlipidemia, unspecified: Secondary | ICD-10-CM

## 2022-11-11 DIAGNOSIS — R931 Abnormal findings on diagnostic imaging of heart and coronary circulation: Secondary | ICD-10-CM

## 2022-11-11 DIAGNOSIS — I10 Essential (primary) hypertension: Secondary | ICD-10-CM | POA: Diagnosis not present

## 2022-11-11 DIAGNOSIS — R002 Palpitations: Secondary | ICD-10-CM | POA: Diagnosis not present

## 2022-11-11 NOTE — Patient Instructions (Signed)
Medication Instructions:  NO CHANGES  *If you need a refill on your cardiac medications before your next appointment, please call your pharmacy*     Follow-Up: At [facility], you and your health needs are our priority.  As part of our continuing mission to provide you with exceptional heart care, we have created designated Provider Care Teams.  These Care Teams include your primary Cardiologist (physician) and Advanced Practice Providers (APPs -  Physician Assistants and Nurse Practitioners) who all work together to provide you with the care you need, when you need it.  We recommend signing up for the patient portal called "MyChart".  Sign up information is provided on this After Visit Summary.  MyChart is used to connect with patients for Virtual Visits (Telemedicine).  Patients are able to view lab/test results, encounter notes, upcoming appointments, etc.  Non-urgent messages can be sent to your provider as well.   To learn more about what you can do with MyChart, go to [email or web address].    Your next appointment:    AS NEEDED with Dr. Antoine Poche

## 2022-11-12 DIAGNOSIS — M9901 Segmental and somatic dysfunction of cervical region: Secondary | ICD-10-CM | POA: Diagnosis not present

## 2022-11-12 DIAGNOSIS — M5116 Intervertebral disc disorders with radiculopathy, lumbar region: Secondary | ICD-10-CM | POA: Diagnosis not present

## 2022-11-12 DIAGNOSIS — M9903 Segmental and somatic dysfunction of lumbar region: Secondary | ICD-10-CM | POA: Diagnosis not present

## 2022-11-12 DIAGNOSIS — M50322 Other cervical disc degeneration at C5-C6 level: Secondary | ICD-10-CM | POA: Diagnosis not present

## 2022-11-16 DIAGNOSIS — M50322 Other cervical disc degeneration at C5-C6 level: Secondary | ICD-10-CM | POA: Diagnosis not present

## 2022-11-16 DIAGNOSIS — M9901 Segmental and somatic dysfunction of cervical region: Secondary | ICD-10-CM | POA: Diagnosis not present

## 2022-11-16 DIAGNOSIS — M9903 Segmental and somatic dysfunction of lumbar region: Secondary | ICD-10-CM | POA: Diagnosis not present

## 2022-11-16 DIAGNOSIS — M5116 Intervertebral disc disorders with radiculopathy, lumbar region: Secondary | ICD-10-CM | POA: Diagnosis not present

## 2022-11-20 ENCOUNTER — Ambulatory Visit: Payer: Medicare HMO | Admitting: Cardiology

## 2022-11-23 ENCOUNTER — Ambulatory Visit: Payer: Medicare HMO

## 2022-12-10 ENCOUNTER — Ambulatory Visit: Payer: Medicare HMO | Admitting: Cardiology

## 2022-12-24 ENCOUNTER — Ambulatory Visit
Admission: RE | Admit: 2022-12-24 | Discharge: 2022-12-24 | Disposition: A | Discharge status: Home / Self Care | Payer: Medicare HMO | Source: Ambulatory Visit | Attending: Internal Medicine | Admitting: Internal Medicine

## 2022-12-24 DIAGNOSIS — Z1231 Encounter for screening mammogram for malignant neoplasm of breast: Secondary | ICD-10-CM

## 2022-12-29 ENCOUNTER — Other Ambulatory Visit: Payer: Self-pay | Admitting: Internal Medicine

## 2022-12-29 DIAGNOSIS — R928 Other abnormal and inconclusive findings on diagnostic imaging of breast: Secondary | ICD-10-CM

## 2023-01-12 ENCOUNTER — Ambulatory Visit (INDEPENDENT_AMBULATORY_CARE_PROVIDER_SITE_OTHER): Payer: Medicare HMO | Admitting: Internal Medicine

## 2023-01-12 ENCOUNTER — Encounter: Payer: Self-pay | Admitting: Internal Medicine

## 2023-01-12 VITALS — BP 118/78 | HR 41 | Temp 98.1°F | Ht 68.0 in

## 2023-01-12 DIAGNOSIS — R928 Other abnormal and inconclusive findings on diagnostic imaging of breast: Secondary | ICD-10-CM | POA: Diagnosis not present

## 2023-01-12 DIAGNOSIS — R202 Paresthesia of skin: Secondary | ICD-10-CM

## 2023-01-12 DIAGNOSIS — R109 Unspecified abdominal pain: Secondary | ICD-10-CM

## 2023-01-12 DIAGNOSIS — I10 Essential (primary) hypertension: Secondary | ICD-10-CM

## 2023-01-12 LAB — COMPREHENSIVE METABOLIC PANEL
ALT: 20 U/L (ref 0–35)
AST: 38 U/L — ABNORMAL HIGH (ref 0–37)
Albumin: 3.9 g/dL (ref 3.5–5.2)
Alkaline Phosphatase: 70 U/L (ref 39–117)
BUN: 21 mg/dL (ref 6–23)
CO2: 21 meq/L (ref 19–32)
Calcium: 9.7 mg/dL (ref 8.4–10.5)
Chloride: 108 meq/L (ref 96–112)
Creatinine, Ser: 1.26 mg/dL — ABNORMAL HIGH (ref 0.40–1.20)
GFR: 43.31 mL/min — ABNORMAL LOW (ref >60.00)
Glucose, Bld: 77 mg/dL (ref 70–99)
Potassium: 4.1 meq/L (ref 3.5–5.1)
Sodium: 139 meq/L (ref 135–145)
Total Bilirubin: 0.5 mg/dL (ref 0.2–1.2)
Total Protein: 7 g/dL (ref 6.0–8.3)

## 2023-01-12 LAB — CBC WITH DIFFERENTIAL/PLATELET
Basophils Absolute: 0 10*3/uL (ref 0.0–0.1)
Basophils Relative: 1 % (ref 0.0–3.0)
Eosinophils Absolute: 0.2 10*3/uL (ref 0.0–0.7)
Eosinophils Relative: 5.4 % — ABNORMAL HIGH (ref 0.0–5.0)
HCT: 43.8 % (ref 36.0–46.0)
Hemoglobin: 14.5 g/dL (ref 12.0–15.0)
Lymphocytes Relative: 41.1 % (ref 12.0–46.0)
Lymphs Abs: 1.6 10*3/uL (ref 0.7–4.0)
MCHC: 33 g/dL (ref 30.0–36.0)
MCV: 94.6 fL (ref 78.0–100.0)
Monocytes Absolute: 0.4 10*3/uL (ref 0.1–1.0)
Monocytes Relative: 11.6 % (ref 3.0–12.0)
Neutro Abs: 1.6 10*3/uL (ref 1.4–7.7)
Neutrophils Relative %: 40.9 % — ABNORMAL LOW (ref 43.0–77.0)
Platelets: 185 10*3/uL (ref 150.0–400.0)
RBC: 4.63 Mil/uL (ref 3.87–5.11)
RDW: 13.6 % (ref 11.5–15.5)
WBC: 3.9 10*3/uL — ABNORMAL LOW (ref 4.0–10.5)

## 2023-01-12 LAB — VITAMIN B12: Vitamin B-12: 795 pg/mL (ref 211–911)

## 2023-01-12 LAB — TSH: TSH: 0.72 u[IU]/mL (ref 0.35–5.50)

## 2023-01-12 NOTE — Assessment & Plan Note (Signed)
Check B12 

## 2023-01-12 NOTE — Assessment & Plan Note (Signed)
Cont on Bisoprolol, Amlodipine 

## 2023-01-12 NOTE — Progress Notes (Signed)
Subjective:  Patient ID: Julia [name], female    DOB: 02/20/1951  Age: 72 y.o. MRN: [id number]  CC: Medical Management of Chronic Issues (4 MNTH F/U, Discuss right rib area muscle pain coming and going x1 year, discuss sensation of (water flowing like feeling) in inner rt shin area)   HPI Julia [name] presents for sensation in the shins - like water R breast asymmetry on mammo - diagnostic mammogram/US pending tomorrow F/u on HTN   Outpatient Medications Prior to Visit  Medication Sig Dispense Refill   acetaminophen (TYLENOL) 500 MG tablet Take 500 mg by mouth at bedtime as needed. As needed at bedtime     amLODipine (NORVASC) 10 MG tablet Take 1 tablet by mouth once daily 90 tablet 3   ASPIRIN 81 PO Take 81 mg by mouth daily in the afternoon.     atorvastatin (LIPITOR) 40 MG tablet Take 1 tablet by mouth once daily 90 tablet 3   BIOTIN PO Take 1 capsule by mouth daily in the afternoon.     bisoprolol (ZEBETA) 5 MG tablet Take 1 tablet by mouth once daily 90 tablet 2   CALCIUM PO Take 1 capsule by mouth daily in the afternoon.     Cholecalciferol (VITAMIN D3) 50 MCG (2000 UT) capsule Take 1 capsule (2,000 Units total) by mouth daily. 100 capsule 3   Ferrous Gluconate (IRON 27 PO) Take by mouth.     Flaxseed, Linseed, (FLAXSEED OIL) 1000 MG CAPS Take 1 capsule by mouth daily in the afternoon.     GARLIC PO Take 1 capsule by mouth daily in the afternoon.     Moringa 500 MG CAPS Take 1 capsule by mouth daily.     Multiple Vitamins-Minerals (CENTRUM SILVER PO) Take 1 tablet by mouth daily in the afternoon.     naproxen sodium (ALEVE) 220 MG tablet Take 220 mg by mouth daily as needed. Per patient takes in AM     Omega-3 Fatty Acids (FISH OIL PO) Take 1 capsule by mouth daily in the afternoon.     TURMERIC PO Take 1 capsule by mouth daily in the afternoon.     Vitamin D-Vitamin K (VITAMIN K2-VITAMIN D3 PO) Take 5,000 Units by mouth daily in the afternoon.     VITAMIN  E PO Take 1 capsule by mouth daily in the afternoon.     methocarbamol (ROBAXIN) 500 MG tablet Take 1 tablet (500 mg total) by mouth at bedtime as needed for muscle spasms. 20 tablet 0   omeprazole (PRILOSEC) 20 MG capsule Take 1 capsule (20 mg total) by mouth daily. 30 capsule 0   No facility-administered medications prior to visit.    ROS: Review of Systems  Constitutional:  Negative for activity change, appetite change, chills, fatigue and unexpected weight change.  HENT:  Negative for congestion, mouth sores and sinus pressure.   Eyes:  Negative for visual disturbance.  Respiratory:  Negative for cough and chest tightness.   Gastrointestinal:  Negative for abdominal pain and nausea.  Genitourinary:  Negative for difficulty urinating, frequency and vaginal pain.  Musculoskeletal:  Negative for back pain and gait problem.  Skin:  Negative for pallor and rash.  Neurological:  Negative for dizziness, tremors, weakness, numbness and headaches.  Psychiatric/Behavioral:  Negative for confusion and sleep disturbance.     Objective:  BP 118/78 (BP Location: Right Arm, Patient Position: Sitting, Cuff Size: Normal)   Pulse (!) 41   Temp 98.1 F (36.7 C) (Oral)   Ht  5\' 8"  (1.727 m)   SpO2 98%   BMI 29.35 kg/m   BP Readings from Last 3 Encounters:  01/12/23 118/78  11/11/22 130/66  09/22/22 110/80    Wt Readings from Last 3 Encounters:  11/11/22 193 lb (87.5 kg)  09/22/22 196 lb (88.9 kg)  06/23/22 197 lb (89.4 kg)    Physical Exam Constitutional:      General: She is not in acute distress.    Appearance: She is well-developed.  HENT:     Head: Normocephalic.     Right Ear: External ear normal.     Left Ear: External ear normal.     Nose: Nose normal.  Eyes:     General:        Right eye: No discharge.        Left eye: No discharge.     Conjunctiva/sclera: Conjunctivae normal.     Pupils: Pupils are equal, round, and reactive to light.  Neck:     Thyroid: No  thyromegaly.     Vascular: No JVD.     Trachea: No tracheal deviation.  Cardiovascular:     Rate and Rhythm: Normal rate and regular rhythm.     Heart sounds: Normal heart sounds.  Pulmonary:     Effort: No respiratory distress.     Breath sounds: No stridor. No wheezing.  Abdominal:     General: Bowel sounds are normal. There is no distension.     Palpations: Abdomen is soft. There is no mass.     Tenderness: There is no abdominal tenderness. There is no guarding or rebound.  Musculoskeletal:        General: No tenderness.     Cervical back: Normal range of motion and neck supple. No rigidity.  Lymphadenopathy:     Cervical: No cervical adenopathy.  Skin:    Findings: No erythema or rash.  Neurological:     Cranial Nerves: No cranial nerve deficit.     Motor: No abnormal muscle tone.     Coordination: Coordination normal.     Deep Tendon Reflexes: Reflexes normal.  Psychiatric:        Behavior: Behavior normal.        Thought Content: Thought content normal.        Judgment: Judgment normal.     Lab Results  Component Value Date   WBC 3.3 (L) 05/12/2022   HGB 14.2 05/12/2022   HCT 42.0 05/12/2022   PLT 184.0 05/12/2022   GLUCOSE 75 05/12/2022   CHOL 204 (H) 05/12/2022   TRIG 104.0 05/12/2022   HDL 72.50 05/12/2022   LDLCALC 110 (H) 05/12/2022   ALT 62 (H) 05/12/2022   AST 73 (H) 05/12/2022   NA 138 05/12/2022   K 4.0 05/12/2022   CL 103 05/12/2022   CREATININE 0.96 05/12/2022   BUN 15 05/12/2022   CO2 24 05/12/2022   TSH 0.78 05/12/2022    MM 3D SCREENING MAMMOGRAM BILATERAL BREAST  Result Date: 12/28/2022 CLINICAL DATA:  Screening. EXAM: DIGITAL SCREENING BILATERAL MAMMOGRAM WITH TOMOSYNTHESIS AND CAD TECHNIQUE: Bilateral screening digital craniocaudal and mediolateral oblique mammograms were obtained. Bilateral screening digital breast tomosynthesis was performed. The images were evaluated with computer-aided detection. COMPARISON:  Previous exam(s). ACR  Breast Density Category b: There are scattered areas of fibroglandular density. FINDINGS: In the right breast, a possible asymmetry warrants further evaluation. In the left breast, no findings suspicious for malignancy. IMPRESSION: Further evaluation is suggested for possible asymmetry in the right breast. RECOMMENDATION: Diagnostic  mammogram and possibly ultrasound of the right breast. (Code:[id number]) The patient will be contacted regarding the findings, and additional imaging will be scheduled. BI-RADS CATEGORY  0: Incomplete: Need additional imaging evaluation. Electronically Signed   By: Annia Belt M.D.   On: 12/28/2022 15:14    Assessment & Plan:   Problem List Items Addressed This Visit     HTN (hypertension)    Cont on Bisoprolol, Amlodipine      Relevant Orders   Comprehensive metabolic panel   Vitamin B12   TSH   CBC with Differential/Platelet   Paresthesias    Check B12      Relevant Orders   Comprehensive metabolic panel   Vitamin B12   TSH   CBC with Differential/Platelet   Flank pain    [facility] F/u w/[facility]      Abnormal mammogram - Primary     R breast asymmetry on mammo - diagnostic mammogram/US pending tomorrow      Relevant Orders   Comprehensive metabolic panel   Vitamin B12   TSH   CBC with Differential/Platelet      No orders of the defined types were placed in this encounter.     Follow-up: Return in about 4 months (around 05/12/2023) for a follow-up visit.  Sonda Primes, MD

## 2023-01-12 NOTE — Assessment & Plan Note (Signed)
R breast asymmetry on mammo - diagnostic mammogram/US pending tomorrow

## 2023-01-12 NOTE — Assessment & Plan Note (Signed)
[facility] F/u w/[facility]

## 2023-01-13 ENCOUNTER — Ambulatory Visit: Payer: Medicare HMO

## 2023-01-13 ENCOUNTER — Ambulatory Visit
Admission: RE | Admit: 2023-01-13 | Discharge: 2023-01-13 | Disposition: A | Discharge status: Home / Self Care | Payer: Medicare HMO | Source: Ambulatory Visit | Attending: Internal Medicine | Admitting: Internal Medicine

## 2023-01-13 DIAGNOSIS — R928 Other abnormal and inconclusive findings on diagnostic imaging of breast: Secondary | ICD-10-CM | POA: Diagnosis not present

## 2023-01-26 DIAGNOSIS — H524 Presbyopia: Secondary | ICD-10-CM | POA: Diagnosis not present

## 2023-01-26 DIAGNOSIS — H2513 Age-related nuclear cataract, bilateral: Secondary | ICD-10-CM | POA: Diagnosis not present

## 2023-02-07 ENCOUNTER — Other Ambulatory Visit: Payer: Self-pay | Admitting: Internal Medicine

## 2023-04-26 ENCOUNTER — Ambulatory Visit (INDEPENDENT_AMBULATORY_CARE_PROVIDER_SITE_OTHER): Payer: Medicare HMO

## 2023-04-26 VITALS — Ht 68.0 in | Wt 193.0 lb

## 2023-04-26 DIAGNOSIS — Z0000 Encounter for general adult medical examination without abnormal findings: Secondary | ICD-10-CM | POA: Diagnosis not present

## 2023-04-26 NOTE — Patient Instructions (Addendum)
 Julia [name] , Thank you for taking time to come for your Medicare Wellness Visit. I appreciate your ongoing commitment to your health goals. Please review the following plan we discussed and let me know if I can assist you in the future.   Referrals/Orders/Follow-Ups/Clinician Recommendations: Aim for 30 minutes of exercise or brisk walking, 6-8 glasses of water, and 5 servings of fruits and vegetables each day.   This is a list of the screening recommended for you and due dates:  Health Maintenance  Topic Date Due   Medicare Annual Wellness Visit  04/25/2024   Colon Cancer Screening  11/19/2024   Mammogram  12/23/2024   DTaP/Tdap/Td vaccine (2 - Td or Tdap) 09/08/2027   Pneumonia Vaccine  Completed   Flu Shot  Completed   DEXA scan (bone density measurement)  Completed   COVID-19 Vaccine  Completed   Hepatitis C Screening  Completed   Zoster (Shingles) Vaccine  Completed   HPV Vaccine  Aged Out    Advanced directives: (Declined) Advance directive discussed with you today. Even though you declined this today, please call our office should you change your mind, and we can give you the proper paperwork for you to fill out.  Next Medicare Annual Wellness Visit scheduled for next year: Yes - 04/2024

## 2023-04-26 NOTE — Progress Notes (Addendum)
 Subjective:  Please attest and cosign this visit due to patients primary care provider not being in the office at the time the visit was completed.  (Pt of Dr A. Plotnikov)   Julia [name] is a 73 y.o. who presents for a Medicare Wellness preventive visit.  Visit Complete: Virtual I connected with  Julia [name] on 04/26/23 by a audio enabled telemedicine application and verified that I am speaking with the correct person using two identifiers.  Patient Location: Home  Provider Location: Office/Clinic  I discussed the limitations of evaluation and management by telemedicine. The patient expressed understanding and agreed to proceed.  Vital Signs: Because this visit was a virtual/telehealth visit, some criteria may be missing or patient reported. Any vitals not documented were not able to be obtained and vitals that have been documented are patient reported.  VideoDeclined- This patient declined Librarian, academic. Therefore the visit was completed with audio only.  AWV Questionnaire: No: Patient Medicare AWV questionnaire was not completed prior to this visit.  Cardiac Risk Factors include: advanced age (>73men, >48 women);dyslipidemia;hypertension     Objective:    Today's Vitals   04/26/23 0803  Weight: 193 lb (87.5 kg)  Height: 5\' 8"  (1.727 m)   Body mass index is 29.35 kg/m.     04/26/2023    8:01 AM 06/08/2022    2:38 PM 06/05/2021    1:04 PM  Advanced Directives  Does Patient Have a Medical Advance Directive? No No No  Would patient like information on creating a medical advance directive? No - Patient declined No - Patient declined No - Patient declined    Current Medications (verified) Outpatient Encounter Medications as of 04/26/2023  Medication Sig   acetaminophen (TYLENOL) 500 MG tablet Take 500 mg by mouth at bedtime as needed. As needed at bedtime   amLODipine (NORVASC) 10 MG tablet Take 1 tablet by mouth once  daily   ASPIRIN 81 PO Take 81 mg by mouth daily in the afternoon.   atorvastatin (LIPITOR) 40 MG tablet Take 1 tablet by mouth once daily   BIOTIN PO Take 1 capsule by mouth daily in the afternoon.   bisoprolol (ZEBETA) 5 MG tablet Take 1 tablet by mouth once daily   CALCIUM PO Take 1 capsule by mouth daily in the afternoon.   Cholecalciferol (VITAMIN D3) 50 MCG (2000 UT) capsule Take 1 capsule (2,000 Units total) by mouth daily.   Ferrous Gluconate (IRON 27 PO) Take by mouth.   Flaxseed, Linseed, (FLAXSEED OIL) 1000 MG CAPS Take 1 capsule by mouth daily in the afternoon.   GARLIC PO Take 1 capsule by mouth daily in the afternoon.   Moringa 500 MG CAPS Take 1 capsule by mouth daily.   Multiple Vitamins-Minerals (CENTRUM SILVER PO) Take 1 tablet by mouth daily in the afternoon.   naproxen sodium (ALEVE) 220 MG tablet Take 220 mg by mouth daily as needed. Per patient takes in AM   Omega-3 Fatty Acids (FISH OIL PO) Take 1 capsule by mouth daily in the afternoon.   TURMERIC PO Take 1 capsule by mouth daily in the afternoon.   Vitamin D-Vitamin K (VITAMIN K2-VITAMIN D3 PO) Take 5,000 Units by mouth daily in the afternoon.   VITAMIN E PO Take 1 capsule by mouth daily in the afternoon.   No facility-administered encounter medications on file as of 04/26/2023.    Allergies (verified) Pravastatin   History: Past Medical History:  Diagnosis Date   Arthritis  GERD (gastroesophageal reflux disease)    Hyperlipidemia    Hypertension    Past Surgical History:  Procedure Laterality Date   BREAST BIOPSY     COLONOSCOPY     [facility] 2015-2016 said it was ok   ESOPHAGOGASTRODUODENOSCOPY     2007-2008 [facility] Grier Mitts was told that she had a ulcer   JOINT REPLACEMENT     Both knees   REPLACEMENT TOTAL KNEE BILATERAL     Family History  Problem Relation Age of Onset   Arthritis Mother    Heart disease Mother        Died of "poor circulation"    Hyperlipidemia Mother     Hypertension Mother    Cancer Father    Pancreatic cancer Father    Diabetes Sister    Hypertension Sister    Hypertension Brother    Prostate cancer Brother    Drug abuse Brother    Heart disease Brother    Social History   Socioeconomic History   Marital status: Widowed    Spouse name: Not on file   Number of children: Not on file   Years of education: Not on file   Highest education level: Associate degree: occupational, Scientist, product/process development, or vocational program  Occupational History   Not on file  Tobacco Use   Smoking status: Never    Passive exposure: Never   Smokeless tobacco: Never  Vaping Use   Vaping status: Never Used  Substance and Sexual Activity   Alcohol use: Not Currently    Alcohol/week: 1.0 standard drink of alcohol    Types: 1 Standard drinks or equivalent per week    Comment: occ for holidays   Drug use: Never   Sexual activity: Yes  Other Topics Concern   Not on file  Social History Narrative   Three children.  5 grand.     Social Drivers of Corporate investment banker Strain: Low Risk  (04/26/2023)   Overall Financial Resource Strain (CARDIA)    Difficulty of Paying Living Expenses: Not hard at all  Food Insecurity: No Food Insecurity (04/26/2023)   Hunger Vital Sign    Worried About Running Out of Food in the Last Year: Never true    Ran Out of Food in the Last Year: Never true  Transportation Needs: No Transportation Needs (04/26/2023)   PRAPARE - Administrator, Civil Service (Medical): No    Lack of Transportation (Non-Medical): No  Physical Activity: Sufficiently Active (04/26/2023)   Exercise Vital Sign    Days of Exercise per Week: 6 days    Minutes of Exercise per Session: 40 min  Stress: No Stress Concern Present (04/26/2023)   [employer] of Occupational Health - Occupational Stress Questionnaire    Feeling of Stress : Not at all  Social Connections: Moderately Isolated (04/26/2023)   Social Connection and Isolation Panel [NHANES]     Frequency of Communication with Friends and Family: More than three times a week    Frequency of Social Gatherings with Friends and Family: More than three times a week    Attends Religious Services: More than 4 times per year    Active Member of [employer] or Organizations: No    Attends Banker Meetings: Never    Marital Status: Widowed    Tobacco Counseling - Non Smoker Counseling given - N/A    Clinical Intake: Completed 04/26/2023  Pre-visit preparation completed: Yes  Pain : No/denies pain     BMI -  recorded: 29.35 Nutritional Status: BMI 25 -29 Overweight Nutritional Risks: None Diabetes: No  How often do you need to have someone help you when you read instructions, pamphlets, or other written materials from your doctor or pharmacy?: 1 - Never  Interpreter Needed?: No  Information entered by :: Hassell Halim, CMA   Activities of Daily Living: Completed 04/26/2023    04/26/2023    8:07 AM 06/08/2022    2:40 PM  In your present state of health, do you have any difficulty performing the following activities:  Hearing? 0 0  Vision? 0 0  Difficulty concentrating or making decisions? 0 0  Walking or climbing stairs? 0 0  Dressing or bathing? 0 0  Doing errands, shopping? 0 0  Preparing Food and eating ? N N  Using the Toilet? N N  In the past six months, have you accidently leaked urine? N N  Do you have problems with loss of bowel control? N N  Managing your Medications? N N  Managing your Finances? N N  Housekeeping or managing your Housekeeping? N N    Patient Care Team: Plotnikov, Georgina Quint, MD as PCP - General (Internal Medicine) Dingeldein, Viviann Spare, MD (Ophthalmology)  Indicate any recent Medical Services you may have received from other than [facility] providers in the past year (date may be approximate).     Assessment:   This is a routine wellness examination for [facility].  Hearing/Vision screen Hearing Screening - Comments:: Denies hearing  difficulties   Vision Screening - Comments:: Wears rx glasses - up to date with routine eye exams with Dr Viviann Spare Dingeldein   Goals Addressed               This Visit's Progress     Patient Stated (pt-stated)        Patient stated that she plans to continue staying active with exercising.       Depression Screen Completed 04/26/2023    04/26/2023    8:14 AM 01/12/2023   11:13 AM 06/23/2022   10:29 AM 06/08/2022    2:47 PM 05/11/2022   10:45 AM 06/05/2021    1:08 PM 05/06/2021    1:43 PM  PHQ 2/9 Scores  PHQ - 2 Score 0 0 0 0 0 0 0  PHQ- 9 Score 0          Fall Risk Completed 04/26/2023    04/26/2023    8:08 AM 01/12/2023   11:13 AM 06/23/2022   10:29 AM 06/08/2022    2:33 PM 06/07/2022   12:47 PM  Fall Risk   Falls in the past year? 0 0 0 0 0  Number falls in past yr: 0 0 0 0   Injury with Fall? 0 0 0 0 0  Risk for fall due to : No Fall Risks No Fall Risks No Fall Risks    Follow up Falls prevention discussed;Falls evaluation completed Falls evaluation completed Falls evaluation completed      MEDICARE RISK AT HOME: Completed 04/26/2023 Medicare Risk at Home Any stairs in or around the home?: Yes (outside) If so, are there any without handrails?: No Home free of loose throw rugs in walkways, pet beds, electrical cords, etc?: Yes Adequate lighting in your home to reduce risk of falls?: Yes Life alert?: No Use of a cane, walker or w/c?: No Grab bars in the bathroom?: Yes Shower chair or bench in shower?: Yes Elevated toilet seat or a handicapped toilet?: Yes  TIMED UP AND GO:  Was the test performed?  No  Cognitive Function: 6CIT completed        04/26/2023    8:10 AM 06/08/2022    2:43 PM 06/05/2021    1:17 PM  6CIT Screen  What Year? 0 points 0 points 0 points  What month? 0 points 0 points 0 points  What time? 0 points 0 points 0 points  Count back from 20 0 points 0 points 0 points  Months in reverse 0 points 0 points 0 points  Repeat phrase 0 points 2 points  0 points  Total Score 0 points 2 points 0 points    Immunizations Immunization History  Administered Date(s) Administered   Fluad Quad(high Dose 65+) 10/25/2019, 10/22/2020, 10/11/2021   Influenza-Unspecified 10/17/2022   Moderna SARS-COV2 Booster Vaccination 12/18/2019, 05/24/2020   Moderna Sars-Covid-2 Vaccination 04/07/2019, 05/05/2019   PNEUMOCOCCAL CONJUGATE-20 11/05/2020   Pfizer Covid-19 Vaccine Bivalent Booster 39yrs & up 11/17/2020, 05/27/2022   Pfizer(Comirnaty)Fall Seasonal Vaccine 12 years and older 05/27/2022, 11/03/2022   Pneumococcal Conjugate-13 09/07/2017   Respiratory Syncytial Virus Vaccine,Recomb Aduvanted(Arexvy) 10/06/2021   Tdap 09/07/2017   Zoster Recombinant(Shingrix) 11/23/2017, 02/05/2018    Screening Tests Health Maintenance  Topic Date Due   Medicare Annual Wellness (AWV)  04/25/2024   Colonoscopy  11/19/2024   MAMMOGRAM  12/23/2024   DTaP/Tdap/Td (2 - Td or Tdap) 09/08/2027   Pneumonia Vaccine 44+ Years old  Completed   INFLUENZA VACCINE  Completed   DEXA SCAN  Completed   COVID-19 Vaccine  Completed   Hepatitis C Screening  Completed   Zoster Vaccines- Shingrix  Completed   HPV VACCINES  Aged Out    Health Maintenance  There are no preventive care reminders to display for this patient. Health Maintenance Items Addressed: 04/26/2023   Additional Screening:  Vision Screening: Recommended annual ophthalmology exams for early detection of glaucoma and other disorders of the eye. Pt stated has an eye exam appt w/Dr Viviann Spare Dingledein in 2025.   Dental Screening: Recommended annual dental exams for proper oral hygiene.  Community Resource Referral / Chronic Care Management: CRR required this visit?  No   CCM required this visit?  No     Plan:     I have personally reviewed and noted the following in the patient's chart:   Medical and social history Use of alcohol, tobacco or illicit drugs  Current medications and supplements  including opioid prescriptions. Patient is not currently taking opioid prescriptions. Functional ability and status Nutritional status Physical activity Advanced directives List of other physicians Hospitalizations, surgeries, and ER visits in previous 12 months Vitals Screenings to include cognitive, depression, and falls Referrals and appointments  In addition, I have reviewed and discussed with patient certain preventive protocols, quality metrics, and best practice recommendations. A written personalized care plan for preventive services as well as general preventive health recommendations were provided to patient.     Darreld Mclean, CMA   04/26/2023   After Visit Summary: (MyChart) Due to this being a telephonic visit, the after visit summary with patients personalized plan was offered to patient via MyChart   Notes: Nothing significant to report at this time.

## 2023-05-02 IMAGING — US US RENAL
1 series · 14 of 25 positions shown · non-contrast
Comparison: None.

CLINICAL DATA: Decreased GFR.  Hypertension.

EXAM:
RENAL / URINARY TRACT ULTRASOUND COMPLETE

[Series 1: us renal · 0.23mm/px · 14 of 35 slices shown]
[im 1/35]
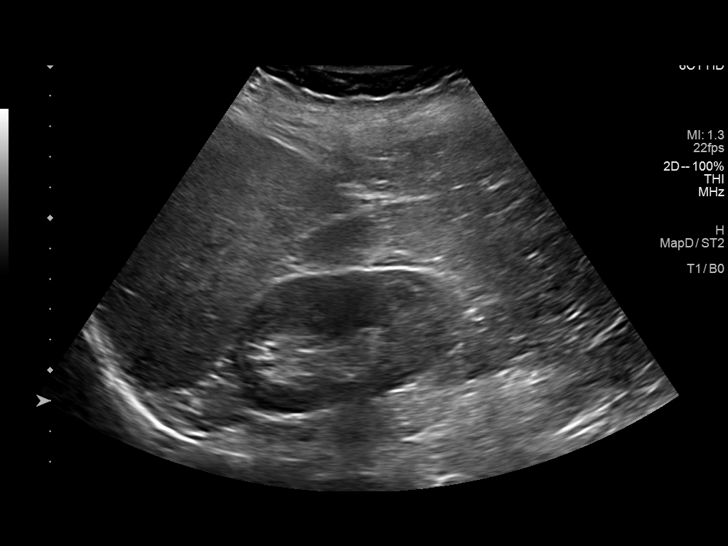
[im 3/35]
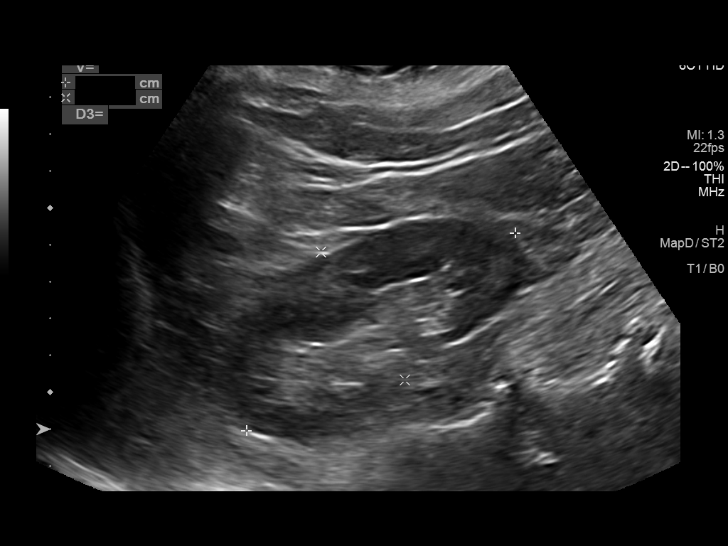
[im 6/35]
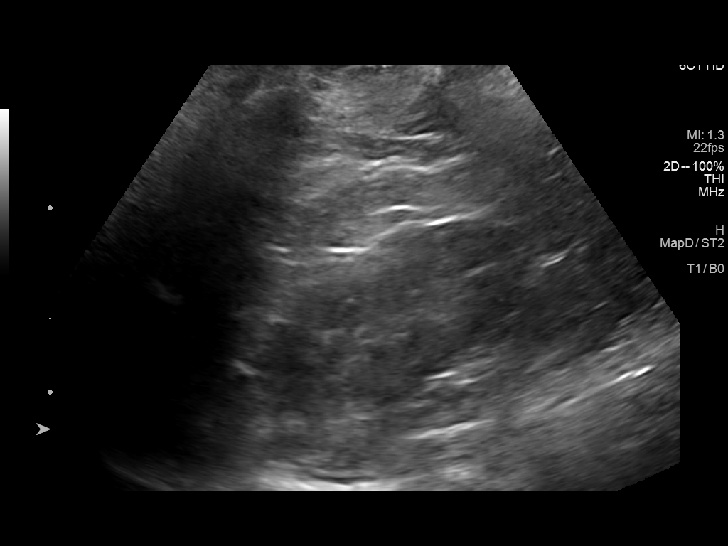
[im 9/35]
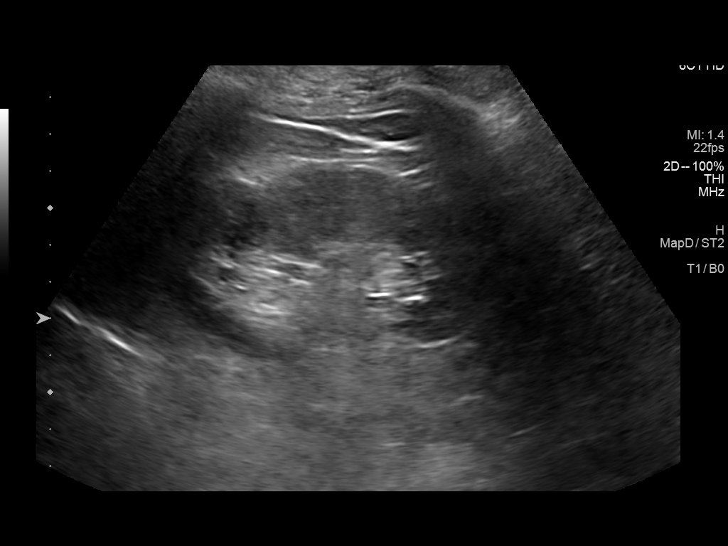
[im 12/35]
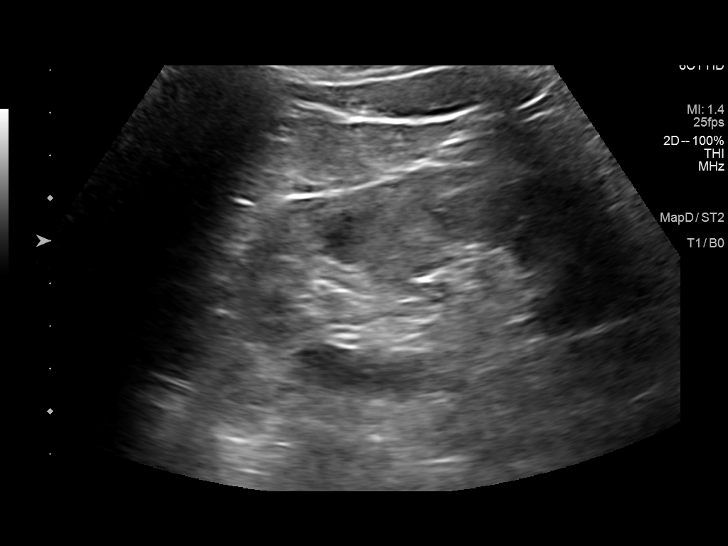
[im 13/35]
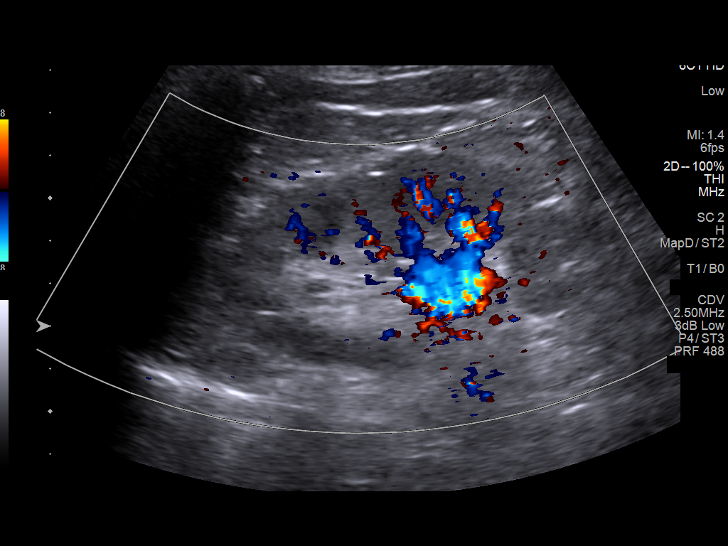
[im 16/35]
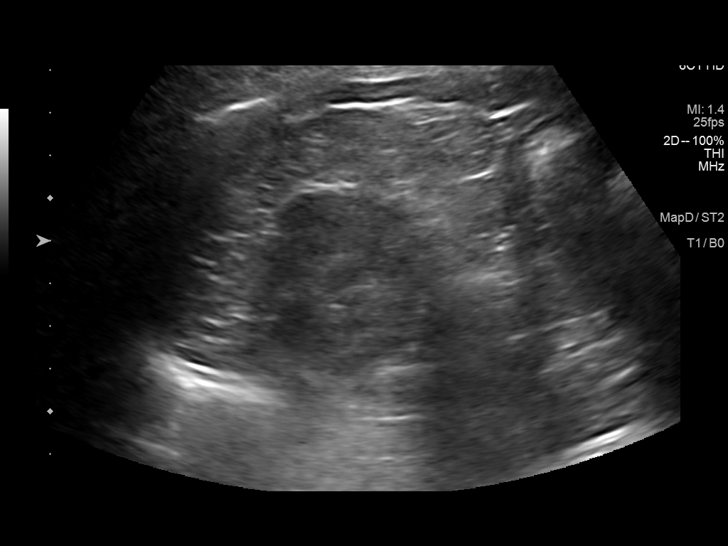
[im 19/35]
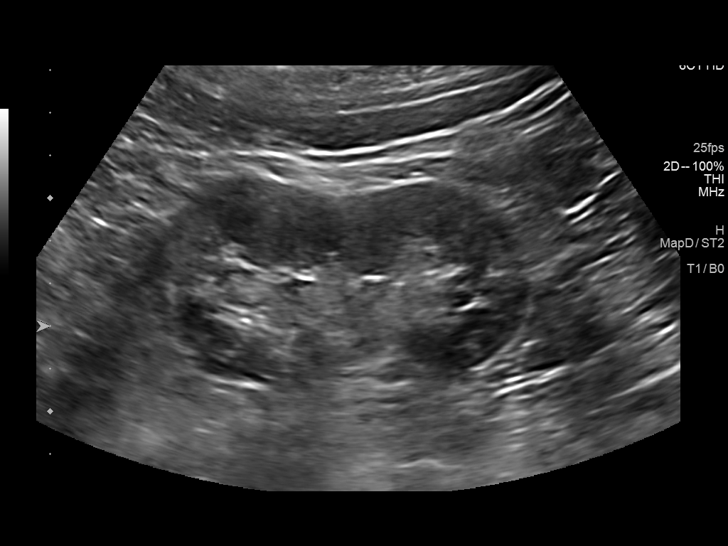
[im 22/35]
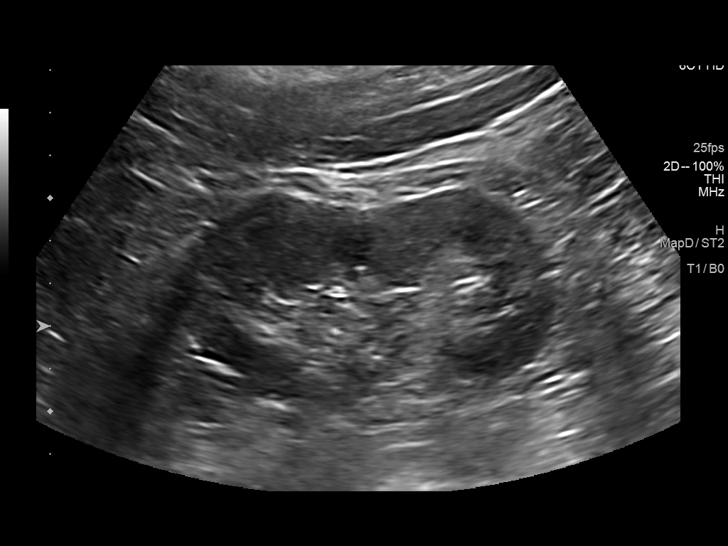
[im 23/35]
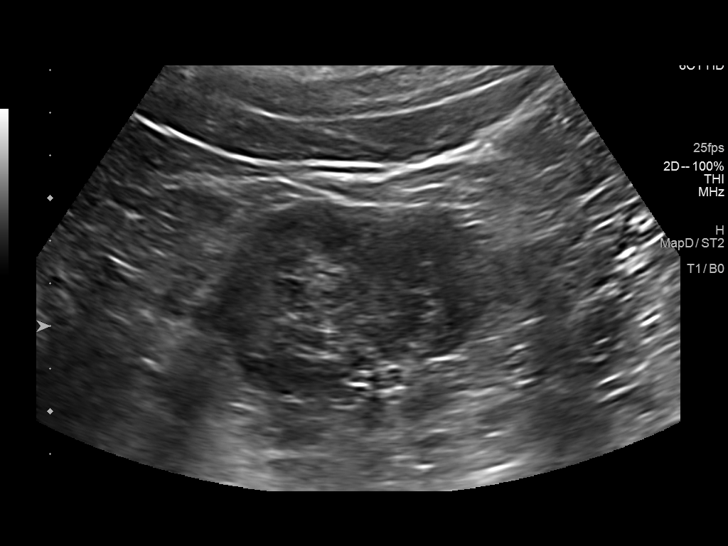
[im 26/35]
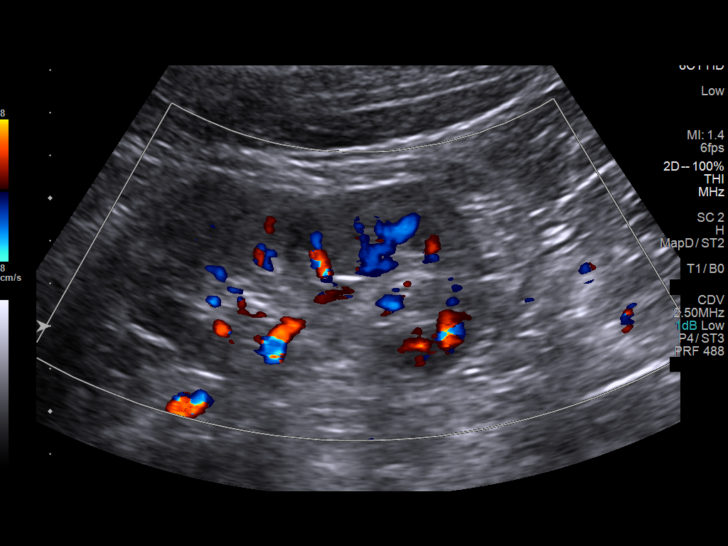
[im 29/35]
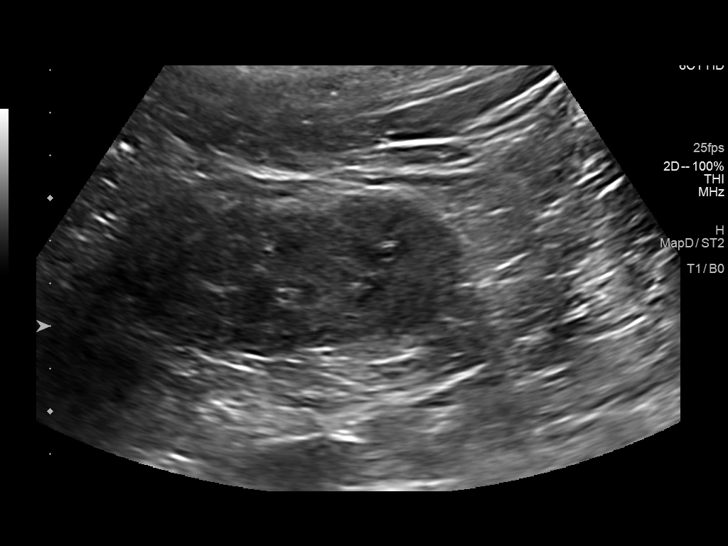
[im 32/35]
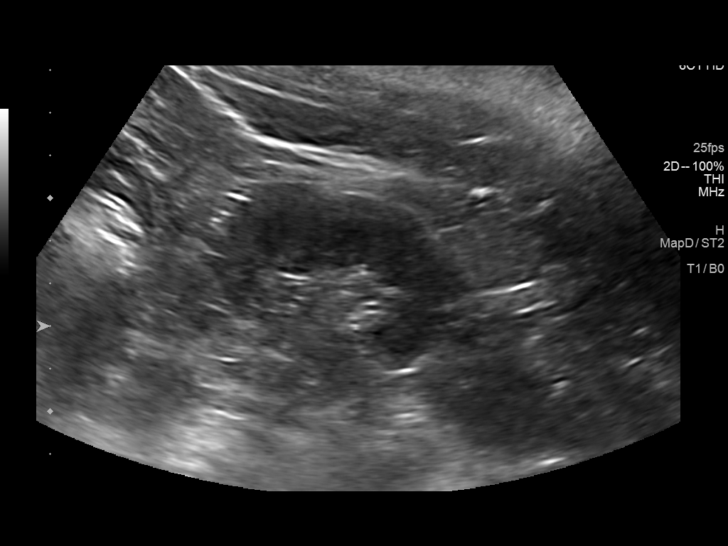
[im 35/35]
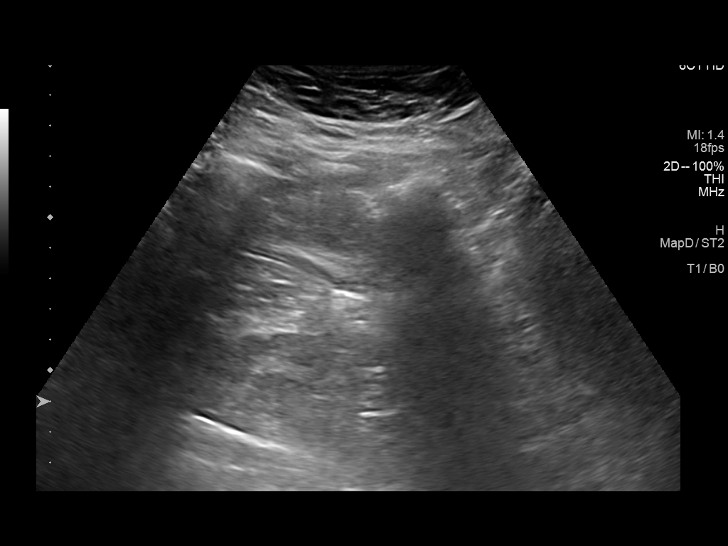

[14 of 25 positions shown; findings below may reference images not displayed]

FINDINGS: Right Kidney:

Renal measurements: 9.1 x 4.2 x 5.8 cm = volume: 114 mL.
Echogenicity within normal limits. No mass or hydronephrosis
visualized.

Left Kidney:

Renal measurements: 9.0 x 4.7 x 4.7 cm = volume: 104 mL.
Echogenicity within normal limits. No mass or hydronephrosis
visualized.

Bladder:

The urinary bladder is contracted and not visualized.

Other:

None.
IMPRESSION: Unremarkable renal ultrasound.

## 2023-05-02 IMAGING — MG MM DIGITAL SCREENING BILAT W/ TOMO AND CAD
8 series · 8 of 24 positions shown · non-contrast
Comparison: None.

CLINICAL DATA: Screening.

EXAM:
DIGITAL SCREENING BILATERAL MAMMOGRAM WITH TOMOSYNTHESIS AND CAD
TECHNIQUE: Bilateral screening digital craniocaudal and mediolateral oblique
mammograms were obtained. Bilateral screening digital breast
tomosynthesis was performed. The images were evaluated with
computer-aided detection.

[L MLO synth-2D]
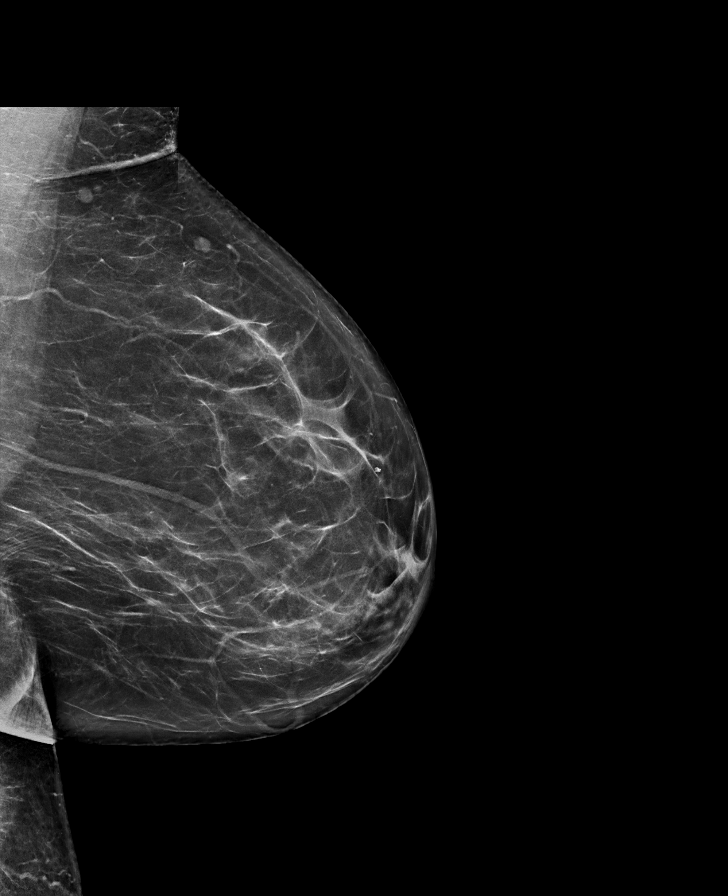

[R CC synth-2D]
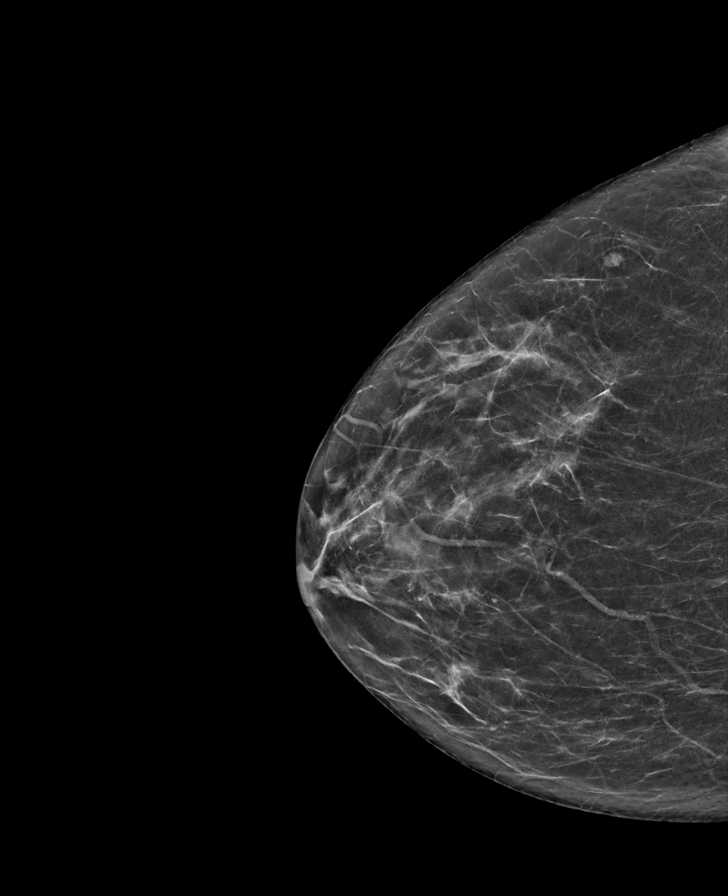

[L CC synth-2D]
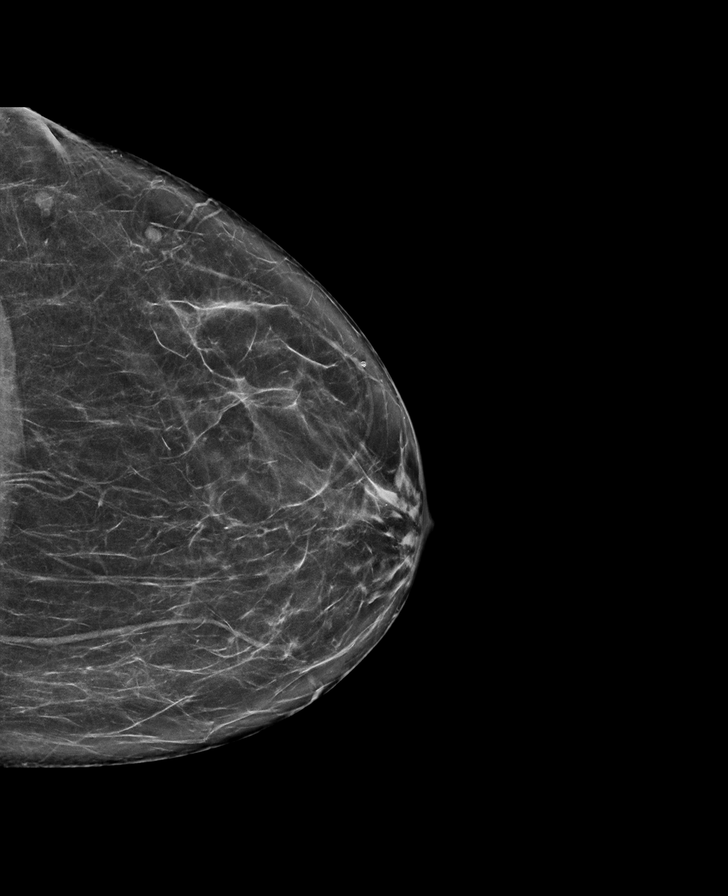

[R MLO synth-2D]
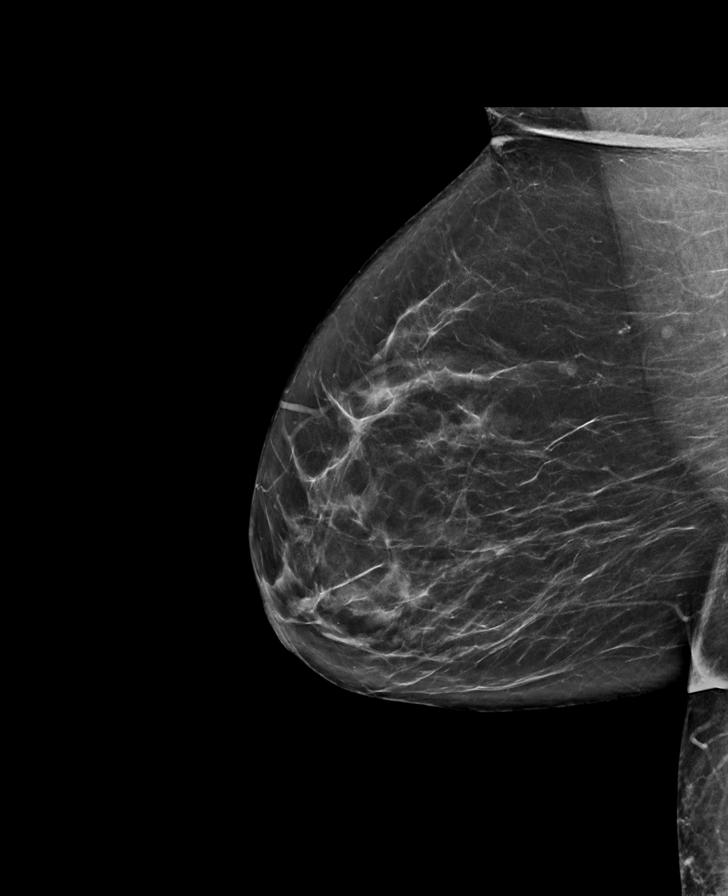

[R CC tomo · tomo slice 31/61.0]
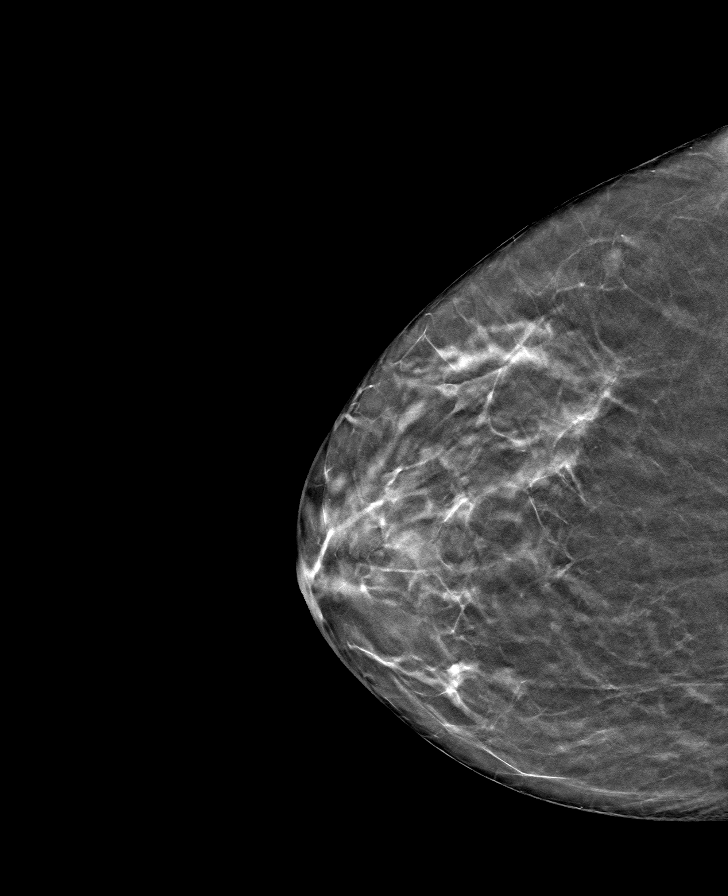

[R MLO tomo · tomo slice 37/73.0]
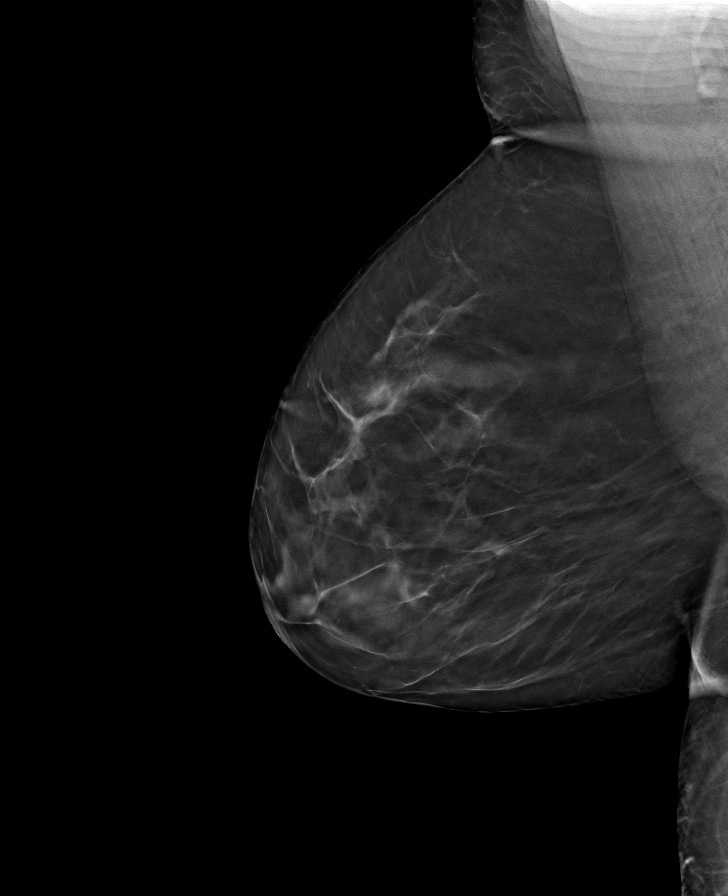

[L CC tomo · tomo slice 35/68.0]
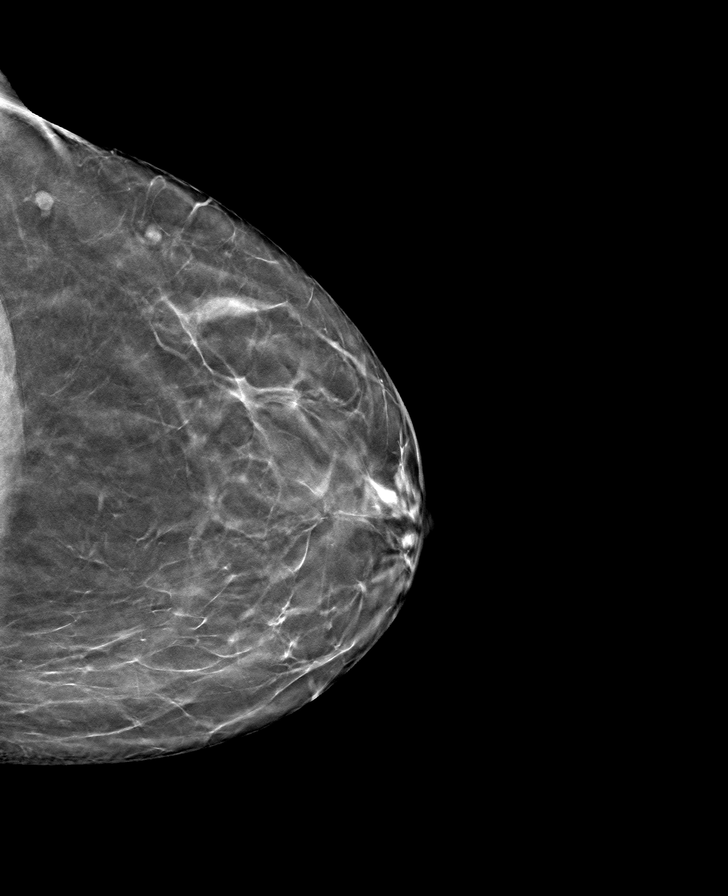

[L MLO tomo · tomo slice 38/75.0]
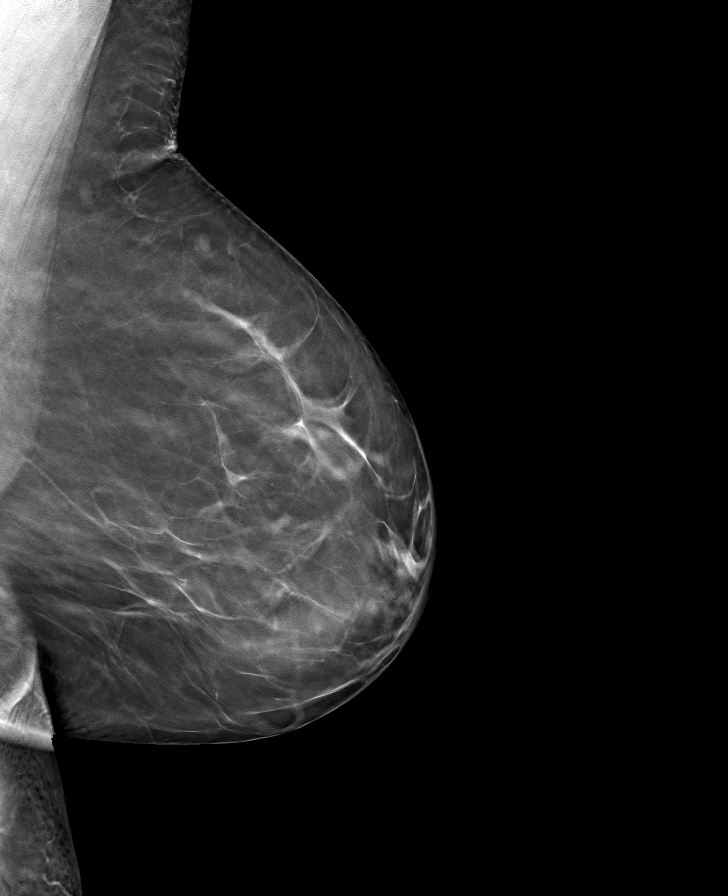

[8 of 24 positions shown; findings below may reference images not displayed]

ACR Breast Density Category b: There are scattered areas of
fibroglandular density.
FINDINGS: There are no findings suspicious for malignancy.
IMPRESSION: No mammographic evidence of malignancy. A result letter of this
screening mammogram will be mailed directly to the patient.

RECOMMENDATION:
Screening mammogram in one year. (Code:XG-X-X7B)

BI-RADS CATEGORY  1: Negative.

## 2023-05-05 ENCOUNTER — Other Ambulatory Visit: Payer: Self-pay | Admitting: Internal Medicine

## 2023-05-12 ENCOUNTER — Encounter: Payer: Self-pay | Admitting: Internal Medicine

## 2023-05-12 ENCOUNTER — Ambulatory Visit (INDEPENDENT_AMBULATORY_CARE_PROVIDER_SITE_OTHER): Payer: Medicare HMO | Admitting: Internal Medicine

## 2023-05-12 VITALS — BP 120/64 | HR 55 | Temp 98.4°F | Ht 68.0 in | Wt 193.0 lb

## 2023-05-12 DIAGNOSIS — R202 Paresthesia of skin: Secondary | ICD-10-CM | POA: Diagnosis not present

## 2023-05-12 DIAGNOSIS — M2550 Pain in unspecified joint: Secondary | ICD-10-CM

## 2023-05-12 DIAGNOSIS — N289 Disorder of kidney and ureter, unspecified: Secondary | ICD-10-CM

## 2023-05-12 DIAGNOSIS — R109 Unspecified abdominal pain: Secondary | ICD-10-CM

## 2023-05-12 DIAGNOSIS — N2889 Other specified disorders of kidney and ureter: Secondary | ICD-10-CM | POA: Insufficient documentation

## 2023-05-12 DIAGNOSIS — E785 Hyperlipidemia, unspecified: Secondary | ICD-10-CM

## 2023-05-12 DIAGNOSIS — E559 Vitamin D deficiency, unspecified: Secondary | ICD-10-CM | POA: Diagnosis not present

## 2023-05-12 DIAGNOSIS — I10 Essential (primary) hypertension: Secondary | ICD-10-CM | POA: Diagnosis not present

## 2023-05-12 DIAGNOSIS — M170 Bilateral primary osteoarthritis of knee: Secondary | ICD-10-CM | POA: Diagnosis not present

## 2023-05-12 DIAGNOSIS — N183 Chronic kidney disease, stage 3 unspecified: Secondary | ICD-10-CM | POA: Diagnosis not present

## 2023-05-12 LAB — CBC WITH DIFFERENTIAL/PLATELET
Basophils Absolute: 0 10*3/uL (ref 0.0–0.1)
Basophils Relative: 0.6 % (ref 0.0–3.0)
Eosinophils Absolute: 0.2 10*3/uL (ref 0.0–0.7)
Eosinophils Relative: 4.1 % (ref 0.0–5.0)
HCT: 40.6 % (ref 36.0–46.0)
Hemoglobin: 13.6 g/dL (ref 12.0–15.0)
Lymphocytes Relative: 37 % (ref 12.0–46.0)
Lymphs Abs: 1.6 10*3/uL (ref 0.7–4.0)
MCHC: 33.5 g/dL (ref 30.0–36.0)
MCV: 93.4 fl (ref 78.0–100.0)
Monocytes Absolute: 0.4 10*3/uL (ref 0.1–1.0)
Monocytes Relative: 9.8 % (ref 3.0–12.0)
Neutro Abs: 2 10*3/uL (ref 1.4–7.7)
Neutrophils Relative %: 48.5 % (ref 43.0–77.0)
Platelets: 178 10*3/uL (ref 150.0–400.0)
RBC: 4.35 Mil/uL (ref 3.87–5.11)
RDW: 13.5 % (ref 11.5–15.5)
WBC: 4.2 10*3/uL (ref 4.0–10.5)

## 2023-05-12 LAB — COMPREHENSIVE METABOLIC PANEL
ALT: 23 U/L (ref 0–35)
AST: 35 U/L (ref 0–37)
Albumin: 4.3 g/dL (ref 3.5–5.2)
Alkaline Phosphatase: 78 U/L (ref 39–117)
BUN: 24 mg/dL — ABNORMAL HIGH (ref 6–23)
CO2: 28 meq/L (ref 19–32)
Calcium: 10.1 mg/dL (ref 8.4–10.5)
Chloride: 104 meq/L (ref 96–112)
Creatinine, Ser: 1.03 mg/dL (ref 0.40–1.20)
GFR: 55.03 mL/min — ABNORMAL LOW (ref >60.00)
Glucose, Bld: 71 mg/dL (ref 70–99)
Potassium: 3.4 meq/L — ABNORMAL LOW (ref 3.5–5.1)
Sodium: 139 meq/L (ref 135–145)
Total Bilirubin: 0.5 mg/dL (ref 0.2–1.2)
Total Protein: 7.7 g/dL (ref 6.0–8.3)

## 2023-05-12 LAB — VITAMIN B12: Vitamin B-12: 771 pg/mL (ref 211–911)

## 2023-05-12 LAB — CK: Total CK: 290 U/L — ABNORMAL HIGH (ref 7–177)

## 2023-05-12 LAB — SEDIMENTATION RATE: Sed Rate: 33 mm/h — ABNORMAL HIGH (ref 0–30)

## 2023-05-12 LAB — VITAMIN D 25 HYDROXY (VIT D DEFICIENCY, FRACTURES): VITD: 67.35 ng/mL (ref 30.00–100.00)

## 2023-05-12 MED ORDER — METHYLPREDNISOLONE 4 MG PO TBPK: ORAL_TABLET | ORAL | 0 refills | Status: DC

## 2023-05-12 MED ORDER — MELOXICAM 7.5 MG PO TABS: 7.5000 mg | ORAL_TABLET | Freq: Every day | ORAL | 2 refills | Status: DC | PRN

## 2023-05-12 NOTE — Progress Notes (Signed)
 Subjective:  Patient ID: Julia [name], female    DOB: 16-Jul-1950  Age: 74 y.o. MRN: [id number]  CC: Medical Management of Chronic Issues (4 mnth f/u, Rt side of lower back and side pain getting worse. Pt states she has family history of Osteoporosis, Rheumatoid Arthritis and wants to be checked for these and other conditions.)   HPI Julia [name] presents for Rt side of lower back and R side/flank pain getting worse. Pt states she has family history of Osteoporosis, Rheumatoid Arthritis and wants to be checked for these and other conditions.  Taking Aleve 2 tabs bid  Outpatient Medications Prior to Visit  Medication Sig Dispense Refill   acetaminophen (TYLENOL) 500 MG tablet Take 500 mg by mouth at bedtime as needed. As needed at bedtime     amLODipine (NORVASC) 10 MG tablet Take 1 tablet by mouth once daily 90 tablet 3   ASPIRIN 81 PO Take 81 mg by mouth daily in the afternoon.     atorvastatin (LIPITOR) 40 MG tablet Take 1 tablet by mouth once daily 90 tablet 3   BIOTIN PO Take 1 capsule by mouth daily in the afternoon.     bisoprolol (ZEBETA) 5 MG tablet Take 1 tablet by mouth once daily 90 tablet 3   CALCIUM PO Take 1 capsule by mouth daily in the afternoon.     Cholecalciferol (VITAMIN D3) 50 MCG (2000 UT) capsule Take 1 capsule (2,000 Units total) by mouth daily. 100 capsule 3   Ferrous Gluconate (IRON 27 PO) Take by mouth.     Flaxseed, Linseed, (FLAXSEED OIL) 1000 MG CAPS Take 1 capsule by mouth daily in the afternoon.     GARLIC PO Take 1 capsule by mouth daily in the afternoon.     Moringa 500 MG CAPS Take 1 capsule by mouth daily.     Multiple Vitamins-Minerals (CENTRUM SILVER PO) Take 1 tablet by mouth daily in the afternoon.     naproxen sodium (ALEVE) 220 MG tablet Take 220 mg by mouth daily as needed. Per patient takes in AM     Omega-3 Fatty Acids (FISH OIL PO) Take 1 capsule by mouth daily in the afternoon.     TURMERIC PO Take 1 capsule by mouth  daily in the afternoon.     Vitamin D-Vitamin K (VITAMIN K2-VITAMIN D3 PO) Take 5,000 Units by mouth daily in the afternoon.     VITAMIN E PO Take 1 capsule by mouth daily in the afternoon.     No facility-administered medications prior to visit.    ROS: Review of Systems  Constitutional:  Positive for fatigue. Negative for activity change, appetite change, chills and unexpected weight change.  HENT:  Negative for congestion, mouth sores and sinus pressure.   Eyes:  Negative for visual disturbance.  Respiratory:  Negative for cough and chest tightness.   Gastrointestinal:  Negative for abdominal pain and nausea.  Genitourinary:  Negative for difficulty urinating, frequency and vaginal pain.  Musculoskeletal:  Positive for arthralgias, back pain, myalgias, neck pain and neck stiffness. Negative for gait problem.  Skin:  Negative for pallor and rash.  Neurological:  Negative for dizziness, tremors, weakness, numbness and headaches.  Psychiatric/Behavioral:  Negative for confusion, sleep disturbance and suicidal ideas.     Objective:  BP 120/64   Pulse (!) 55   Temp 98.4 F (36.9 C) (Oral)   Ht 5\' 8"  (1.727 m)   Wt 193 lb (87.5 kg)   SpO2 98%   BMI 29.35  kg/m   BP Readings from Last 3 Encounters:  05/12/23 120/64  01/12/23 118/78  11/11/22 130/66    Wt Readings from Last 3 Encounters:  05/12/23 193 lb (87.5 kg)  04/26/23 193 lb (87.5 kg)  11/11/22 193 lb (87.5 kg)    Physical Exam Constitutional:      General: She is not in acute distress.    Appearance: She is well-developed. She is obese.  HENT:     Head: Normocephalic.     Right Ear: External ear normal.     Left Ear: External ear normal.     Nose: Nose normal.  Eyes:     General:        Right eye: No discharge.        Left eye: No discharge.     Conjunctiva/sclera: Conjunctivae normal.     Pupils: Pupils are equal, round, and reactive to light.  Neck:     Thyroid: No thyromegaly.     Vascular: No JVD.      Trachea: No tracheal deviation.  Cardiovascular:     Rate and Rhythm: Normal rate and regular rhythm.     Heart sounds: Normal heart sounds.  Pulmonary:     Effort: No respiratory distress.     Breath sounds: No stridor. No wheezing.  Abdominal:     General: Bowel sounds are normal. There is no distension.     Palpations: Abdomen is soft. There is no mass.     Tenderness: There is no abdominal tenderness. There is no guarding or rebound.  Musculoskeletal:        General: Tenderness present.     Cervical back: Normal range of motion and neck supple. No rigidity.  Lymphadenopathy:     Cervical: Cervical adenopathy present.  Skin:    Findings: No erythema or rash.  Neurological:     Mental Status: She is oriented to person, place, and time.     Cranial Nerves: No cranial nerve deficit.     Motor: No abnormal muscle tone.     Coordination: Coordination normal.     Gait: Gait abnormal.     Deep Tendon Reflexes: Reflexes normal.  Psychiatric:        Behavior: Behavior normal.        Thought Content: Thought content normal.        Judgment: Judgment normal.   Str leg elev (-) B  Lab Results  Component Value Date   WBC 3.9 (L) 01/12/2023   HGB 14.5 01/12/2023   HCT 43.8 01/12/2023   PLT 185.0 01/12/2023   GLUCOSE 77 01/12/2023   CHOL 204 (H) 05/12/2022   TRIG 104.0 05/12/2022   HDL 72.50 05/12/2022   LDLCALC 110 (H) 05/12/2022   ALT 20 01/12/2023   AST 38 (H) 01/12/2023   NA 139 01/12/2023   K 4.1 01/12/2023   CL 108 01/12/2023   CREATININE 1.26 (H) 01/12/2023   BUN 21 01/12/2023   CO2 21 01/12/2023   TSH 0.72 01/12/2023    MM 3D DIAGNOSTIC MAMMOGRAM UNILATERAL RIGHT BREAST Result Date: 01/13/2023 CLINICAL DATA:  Screening recall for possible right breast asymmetry EXAM: DIGITAL DIAGNOSTIC UNILATERAL RIGHT MAMMOGRAM WITH TOMOSYNTHESIS AND CAD TECHNIQUE: Right digital diagnostic mammography and breast tomosynthesis was performed. The images were evaluated with  computer-aided detection. COMPARISON:  Previous exam(s). ACR Breast Density Category b: There are scattered areas of fibroglandular density. FINDINGS: The previously described possible asymmetry in the inner right breast does not persist with additional views, consistent with  superimposed fibroglandular tissue. No suspicious mass, microcalcification, or other finding is identified. IMPRESSION: The previously described possible asymmetry does not persist with additional views, consistent with superimposed fibroglandular tissue. No mammographic evidence of malignancy in the right breast. RECOMMENDATION: Return to routine screening mammography is recommended. The patient will be due for screening in November 2025. I have discussed the findings and recommendations with the patient. If applicable, a reminder letter will be sent to the patient regarding the next appointment. BI-RADS CATEGORY  1: Negative. Electronically Signed   By: Jacob Moores M.D.   On: 01/13/2023 11:14    Assessment & Plan:   Problem List Items Addressed This Visit     HTN (hypertension)   Cont on Bisoprolol, Amlodipine      Dyslipidemia   Hold Lipitor x 1 month      Osteoarthritis - Primary   2023 CT: Musculoskeletal: Degenerative changes of lumbar spine are noted. Mild anterolisthesis of L3 on L4 and L4 on L5 is noted. Hold Lipitor x 1 month Taking Aleve 2 tabs bid - asked to d/c due to CRI      Relevant Medications   meloxicam (MOBIC) 7.5 MG tablet   methylPREDNISolone (MEDROL DOSEPAK) 4 MG TBPK tablet   Other Relevant Orders   CK   Comprehensive metabolic panel   Urinalysis   Vitamin B12   VITAMIN D 25 Hydroxy (Vit-D Deficiency, Fractures)   Sedimentation rate   Rheumatoid factor   CBC with Differential/Platelet   Renal insufficiency   GFR 43 D/c naproxen, hydrate well Will try Meloxicam 7.5 -15 mg/d      Flank pain   MSK vs other Str leg elev (-) B MRI if not better F/u w/[facility]       Arthralgia   Worse D/c Lipitor x 1 mo Check ANA, RF Medrol pac, then meloxicam      Relevant Orders   CK   Comprehensive metabolic panel   Urinalysis   Vitamin B12   VITAMIN D 25 Hydroxy (Vit-D Deficiency, Fractures)   Sedimentation rate   Rheumatoid factor   CBC with Differential/Platelet   CRI (chronic renal insufficiency), stage 3 (moderate) (HCC)   Taking Aleve 2 tabs bid - asked to d/c due to CRI Will try Meloxicam 7.5 -15 mg/d      Relevant Orders   CK   Comprehensive metabolic panel   Urinalysis   Vitamin B12   VITAMIN D 25 Hydroxy (Vit-D Deficiency, Fractures)   Sedimentation rate   Rheumatoid factor   CBC with Differential/Platelet   Other Visit Diagnoses       Paresthesia       Relevant Orders   CK   Comprehensive metabolic panel   Urinalysis   Vitamin B12     Vitamin D deficiency       Relevant Orders   VITAMIN D 25 Hydroxy (Vit-D Deficiency, Fractures)   Sedimentation rate         Meds ordered this encounter  Medications   meloxicam (MOBIC) 7.5 MG tablet    Sig: Take 1-2 tablets (7.5-15 mg total) by mouth daily as needed for pain.    Dispense:  60 tablet    Refill:  2   methylPREDNISolone (MEDROL DOSEPAK) 4 MG TBPK tablet    Sig: As directed    Dispense:  21 tablet    Refill:  0      Follow-up: Return in about 6 weeks (around 06/23/2023) for a follow-up visit.  Sonda Primes, MD

## 2023-05-12 NOTE — Patient Instructions (Addendum)
 Stop Atorvastatin and Aleve  USEFUL THINGS FOR ARTHRITIS and musculoskeletal pains:    A "rice sock heating pad" refers to a homemade heating pad created by filling a sock with uncooked rice, which can be heated in a microwave to provide a warm compress for sore muscles, pain relief, or other applications; essentially, it's a simple way to generate heat using readily available materials.  Key points about rice sock heat: How to make it: Fill a clean sock (preferably a tube sock) about 2/3 full with uncooked rice, tie a knot at the top to secure the rice inside.  Heating it up: Place the rice sock in the microwave and heat in short intervals (usually around 30 seconds at a time) until it reaches the desired warmth.  Important considerations: Check temperature before applying: Always test the temperature of the rice sock before applying it to your skin to avoid burns.  Use a towel to protect skin: Wrap the rice sock in a thin towel to distribute the heat evenly and protect your skin.  Uses: Muscle aches and pains  Menstrual cramps  Neck pain  Arthritis discomfort   SILICONE PADS: Use them to open jars and bottles    BRIX JAR OPENER: Use them to open jars    NITRILE COATED GARDEN GLOVES: Use down to open jars, lift boxes, etc.   THUMB BRACE: Use it for thumb arthritis flareup pain     BLUE EMU CREAM: Use it 2-3 times a day on painful areas

## 2023-05-12 NOTE — Assessment & Plan Note (Addendum)
 Taking Aleve 2 tabs bid - asked to d/c due to CRI Will try Meloxicam 7.5 -15 mg/d

## 2023-05-12 NOTE — Assessment & Plan Note (Signed)
Cont on Bisoprolol, Amlodipine 

## 2023-05-12 NOTE — Assessment & Plan Note (Addendum)
 Worse D/c Lipitor x 1 mo Check ANA, RF Medrol pac, then meloxicam

## 2023-05-12 NOTE — Assessment & Plan Note (Signed)
 MSK vs other Str leg elev (-) B MRI if not better F/u w/[facility]

## 2023-05-12 NOTE — Assessment & Plan Note (Addendum)
 2023 CT: Musculoskeletal: Degenerative changes of lumbar spine are noted. Mild anterolisthesis of L3 on L4 and L4 on L5 is noted. Hold Lipitor x 1 month Taking Aleve 2 tabs bid - asked to d/c due to CRI

## 2023-05-12 NOTE — Assessment & Plan Note (Signed)
Hold Lipitor x 1 month

## 2023-05-12 NOTE — Assessment & Plan Note (Addendum)
 GFR 43 D/c naproxen, hydrate well Will try Meloxicam 7.5 -15 mg/d

## 2023-05-13 LAB — URINALYSIS, ROUTINE W REFLEX MICROSCOPIC
Bilirubin Urine: NEGATIVE
Hgb urine dipstick: NEGATIVE
Ketones, ur: NEGATIVE
Nitrite: NEGATIVE
RBC / HPF: NONE SEEN (ref 0–?)
Specific Gravity, Urine: 1.015 (ref 1.000–1.030)
Total Protein, Urine: NEGATIVE
Urine Glucose: NEGATIVE
Urobilinogen, UA: 0.2 (ref 0.0–1.0)
pH: 7.5 (ref 5.0–8.0)

## 2023-05-13 LAB — RHEUMATOID FACTOR: Rheumatoid fact SerPl-aCnc: <10 [IU]/mL (ref <14)

## 2023-05-18 ENCOUNTER — Encounter: Payer: Self-pay | Admitting: Internal Medicine

## 2023-06-18 DIAGNOSIS — M25562 Pain in left knee: Secondary | ICD-10-CM | POA: Diagnosis not present

## 2023-06-18 DIAGNOSIS — M545 Low back pain, unspecified: Secondary | ICD-10-CM | POA: Diagnosis not present

## 2023-06-18 DIAGNOSIS — M25561 Pain in right knee: Secondary | ICD-10-CM | POA: Diagnosis not present

## 2023-06-24 ENCOUNTER — Ambulatory Visit: Admitting: Internal Medicine

## 2023-06-24 ENCOUNTER — Encounter: Payer: Self-pay | Admitting: Internal Medicine

## 2023-06-24 VITALS — BP 132/78 | HR 54 | Temp 98.3°F | Ht 68.0 in | Wt 200.6 lb

## 2023-06-24 DIAGNOSIS — M170 Bilateral primary osteoarthritis of knee: Secondary | ICD-10-CM | POA: Diagnosis not present

## 2023-06-24 DIAGNOSIS — G8929 Other chronic pain: Secondary | ICD-10-CM

## 2023-06-24 DIAGNOSIS — M2550 Pain in unspecified joint: Secondary | ICD-10-CM | POA: Diagnosis not present

## 2023-06-24 DIAGNOSIS — I10 Essential (primary) hypertension: Secondary | ICD-10-CM

## 2023-06-24 DIAGNOSIS — I2583 Coronary atherosclerosis due to lipid rich plaque: Secondary | ICD-10-CM

## 2023-06-24 DIAGNOSIS — E785 Hyperlipidemia, unspecified: Secondary | ICD-10-CM | POA: Diagnosis not present

## 2023-06-24 DIAGNOSIS — M545 Low back pain, unspecified: Secondary | ICD-10-CM | POA: Insufficient documentation

## 2023-06-24 MED ORDER — PREDNISONE 5 MG PO TABS: 5.0000 mg | ORAL_TABLET | Freq: Every day | ORAL | 3 refills | Status: DC

## 2023-06-24 NOTE — Progress Notes (Signed)
 Subjective:  Patient ID: Julia [name], female    DOB: 21-Aug-1950  Age: 73 y.o. MRN: [id number]  CC: Medical Management of Chronic Issues (6 week follow up. Patient has since followed up with Dr.Murphy who had performed x-rays on both knees. Noted that everything looked well with that and suggested they performed an xray on the spine. They noted some arthritis present at the bottom of the spine. They also will be starting physical therapy with patient starting next week. Notes she is currently using lidocaine roll gel and icy hot at home, as well as cold and heat therapy.)   HPI Zareya Prudhomme-Robinson presents for weakness, cramps - better off statins  C/o chronic LBP. Pt was given another medrol  pack by her orthopedist Dr Abigail Abler; starting PT  Outpatient Medications Prior to Visit  Medication Sig Dispense Refill   acetaminophen (TYLENOL) 500 MG tablet Take 500 mg by mouth at bedtime as needed. As needed at bedtime     amLODipine  (NORVASC ) 10 MG tablet Take 1 tablet by mouth once daily 90 tablet 3   ASPIRIN 81 PO Take 81 mg by mouth daily in the afternoon.     atorvastatin  (LIPITOR) 40 MG tablet Take 1 tablet by mouth once daily 90 tablet 3   BIOTIN PO Take 1 capsule by mouth daily in the afternoon.     bisoprolol  (ZEBETA ) 5 MG tablet Take 1 tablet by mouth once daily 90 tablet 3   CALCIUM  PO Take 1 capsule by mouth daily in the afternoon.     Cholecalciferol (VITAMIN D3) 50 MCG (2000 UT) capsule Take 1 capsule (2,000 Units total) by mouth daily. 100 capsule 3   Ferrous Gluconate (IRON 27 PO) Take by mouth.     GARLIC PO Take 1 capsule by mouth daily in the afternoon.     Moringa 500 MG CAPS Take 1 capsule by mouth daily.     Multiple Vitamins-Minerals (CENTRUM SILVER PO) Take 1 tablet by mouth daily in the afternoon.     Omega-3 Fatty Acids (FISH OIL PO) Take 1 capsule by mouth daily in the afternoon.     TURMERIC PO Take 1 capsule by mouth daily in the afternoon.     Vitamin  D-Vitamin K (VITAMIN K2-VITAMIN D3 PO) Take 5,000 Units by mouth daily in the afternoon.     VITAMIN E PO Take 1 capsule by mouth daily in the afternoon.     meloxicam  (MOBIC ) 7.5 MG tablet Take 1-2 tablets (7.5-15 mg total) by mouth daily as needed for pain. 60 tablet 2   methylPREDNISolone  (MEDROL  DOSEPAK) 4 MG TBPK tablet As directed 21 tablet 0   naproxen sodium (ALEVE) 220 MG tablet Take 220 mg by mouth daily as needed. Per patient takes in AM     Flaxseed, Linseed, (FLAXSEED OIL) 1000 MG CAPS Take 1 capsule by mouth daily in the afternoon.     No facility-administered medications prior to visit.    ROS: Review of Systems  Constitutional:  Positive for fatigue. Negative for activity change, appetite change, chills and unexpected weight change.  HENT:  Negative for congestion, mouth sores and sinus pressure.   Eyes:  Negative for visual disturbance.  Respiratory:  Negative for cough and chest tightness.   Cardiovascular:  Negative for leg swelling.  Gastrointestinal:  Negative for abdominal pain and nausea.  Genitourinary:  Negative for difficulty urinating, frequency and vaginal pain.  Musculoskeletal:  Positive for arthralgias, back pain and gait problem.  Skin:  Negative for pallor  and rash.  Neurological:  Negative for dizziness, tremors, weakness, numbness and headaches.  Psychiatric/Behavioral:  Negative for confusion and sleep disturbance.     Objective:  BP 132/78   Pulse (!) 54   Temp 98.3 F (36.8 C)   Ht 5\' 8"  (1.727 m)   Wt 200 lb 9.6 oz (91 kg)   SpO2 98%   BMI 30.50 kg/m   BP Readings from Last 3 Encounters:  06/24/23 132/78  05/12/23 120/64  01/12/23 118/78    Wt Readings from Last 3 Encounters:  06/24/23 200 lb 9.6 oz (91 kg)  05/12/23 193 lb (87.5 kg)  04/26/23 193 lb (87.5 kg)    Physical Exam Constitutional:      General: She is not in acute distress.    Appearance: She is well-developed.  HENT:     Head: Normocephalic.     Right Ear:  External ear normal.     Left Ear: External ear normal.     Nose: Nose normal.  Eyes:     General:        Right eye: No discharge.        Left eye: No discharge.     Conjunctiva/sclera: Conjunctivae normal.     Pupils: Pupils are equal, round, and reactive to light.  Neck:     Thyroid : No thyromegaly.     Vascular: No JVD.     Trachea: No tracheal deviation.  Cardiovascular:     Rate and Rhythm: Normal rate and regular rhythm.     Heart sounds: Normal heart sounds.  Pulmonary:     Effort: No respiratory distress.     Breath sounds: No stridor. No wheezing.  Abdominal:     General: Bowel sounds are normal. There is no distension.     Palpations: Abdomen is soft. There is no mass.     Tenderness: There is no abdominal tenderness. There is no guarding or rebound.  Musculoskeletal:        General: Tenderness present.     Cervical back: Normal range of motion and neck supple. No rigidity.     Right lower leg: No edema.     Left lower leg: No edema.  Lymphadenopathy:     Cervical: No cervical adenopathy.  Skin:    Findings: No erythema or rash.  Neurological:     Cranial Nerves: No cranial nerve deficit.     Motor: No abnormal muscle tone.     Coordination: Coordination normal.     Deep Tendon Reflexes: Reflexes normal.  Psychiatric:        Behavior: Behavior normal.        Thought Content: Thought content normal.        Judgment: Judgment normal.     Lab Results  Component Value Date   WBC 4.2 05/12/2023   HGB 13.6 05/12/2023   HCT 40.6 05/12/2023   PLT 178.0 05/12/2023   GLUCOSE 71 05/12/2023   CHOL 204 (H) 05/12/2022   TRIG 104.0 05/12/2022   HDL 72.50 05/12/2022   LDLCALC 110 (H) 05/12/2022   ALT 23 05/12/2023   AST 35 05/12/2023   NA 139 05/12/2023   K 3.4 (L) 05/12/2023   CL 104 05/12/2023   CREATININE 1.03 05/12/2023   BUN 24 (H) 05/12/2023   CO2 28 05/12/2023   TSH 0.72 01/12/2023    MM 3D DIAGNOSTIC MAMMOGRAM UNILATERAL RIGHT BREAST Result Date:  01/13/2023 CLINICAL DATA:  Screening recall for possible right breast asymmetry EXAM: DIGITAL DIAGNOSTIC UNILATERAL RIGHT  MAMMOGRAM WITH TOMOSYNTHESIS AND CAD TECHNIQUE: Right digital diagnostic mammography and breast tomosynthesis was performed. The images were evaluated with computer-aided detection. COMPARISON:  Previous exam(s). ACR Breast Density Category b: There are scattered areas of fibroglandular density. FINDINGS: The previously described possible asymmetry in the inner right breast does not persist with additional views, consistent with superimposed fibroglandular tissue. No suspicious mass, microcalcification, or other finding is identified. IMPRESSION: The previously described possible asymmetry does not persist with additional views, consistent with superimposed fibroglandular tissue. No mammographic evidence of malignancy in the right breast. RECOMMENDATION: Return to routine screening mammography is recommended. The patient will be due for screening in November 2025. I have discussed the findings and recommendations with the patient. If applicable, a reminder letter will be sent to the patient regarding the next appointment. BI-RADS CATEGORY  1: Negative. Electronically Signed   By: Sande Cromer M.D.   On: 01/13/2023 11:14    Assessment & Plan:   Problem List Items Addressed This Visit     HTN (hypertension)   Cont on Bisoprolol , Amlodipine       Dyslipidemia   Hold Lipitor      Osteoarthritis   [email or web address]: Infrared Sauna Blanket-Sauna Blanket for Home Use On Meloxicam  F/u w/Dr Abigail Abler   Lido patches $$$ Pt declined opioids, gabapentin, muscle relaxers Will use low dose steroids - Prednisone  5-10 mg/d  Potential benefits of a long term steroid  use as well as potential risks  and complications were explained to the patient and were aknowledged. D/c meloxicam       Relevant Medications   predniSONE  (DELTASONE ) 5 MG tablet   Coronary atherosclerosis    She did have a  calcium  score of 38 Lipitor caused weakness, pain - d/c'd      Arthralgia - Primary   [email or web address]: Infrared Sauna Blanket-Sauna Blanket for Home Use      Low back pain   [employer]: Infrared Sauna Blanket-Sauna Blanket for Home Use On Meloxicam  F/u w/Dr Abigail Abler   Lido patches $$$ Pt declined opioids, gabapentin, muscle relaxers Will use low dose steroids - Prednisone  5-10 mg/d  Potential benefits of a long term steroid  use as well as potential risks  and complications were explained to the patient and were aknowledged. D/c meloxicam        Relevant Medications   predniSONE  (DELTASONE ) 5 MG tablet      Meds ordered this encounter  Medications   predniSONE  (DELTASONE ) 5 MG tablet    Sig: Take 1-2 tablets (5-10 mg total) by mouth daily with breakfast.    Dispense:  60 tablet    Refill:  3      Follow-up: No follow-ups on file.  Anitra Barn, MD

## 2023-06-24 NOTE — Assessment & Plan Note (Signed)
 She did have a calcium  score of 38 Lipitor caused weakness, pain - d/c'd

## 2023-06-24 NOTE — Assessment & Plan Note (Signed)
 [employer]: Infrared Sauna Blanket-Sauna Blanket for Home Use On Meloxicam  F/u w/Dr Abigail Abler   Lido patches $$$ Pt declined opioids, gabapentin, muscle relaxers Will use low dose steroids - Prednisone  5-10 mg/d  Potential benefits of a long term steroid  use as well as potential risks  and complications were explained to the patient and were aknowledged. D/c meloxicam 

## 2023-06-24 NOTE — Patient Instructions (Signed)
 [employer]: Infrared Sauna Blanket-Sauna Blanket for Home Use

## 2023-06-24 NOTE — Assessment & Plan Note (Signed)
 -  Hold Lipitor

## 2023-06-24 NOTE — Assessment & Plan Note (Signed)
Cont on Bisoprolol, Amlodipine 

## 2023-06-24 NOTE — Assessment & Plan Note (Addendum)
 [employer]: Infrared Sauna Blanket-Sauna Blanket for Home Use On Meloxicam  F/u w/Dr Abigail Abler   Lido patches $$$ Pt declined opioids, gabapentin, muscle relaxers Will use low dose steroids - Prednisone  5-10 mg/d  Potential benefits of a long term steroid  use as well as potential risks  and complications were explained to the patient and were aknowledged. D/c meloxicam 

## 2023-06-24 NOTE — Assessment & Plan Note (Signed)
 [employer]: Infrared Sauna Blanket-Sauna Blanket for Home Use

## 2023-07-01 DIAGNOSIS — M5451 Vertebrogenic low back pain: Secondary | ICD-10-CM | POA: Diagnosis not present

## 2023-07-08 DIAGNOSIS — M5451 Vertebrogenic low back pain: Secondary | ICD-10-CM | POA: Diagnosis not present

## 2023-07-15 DIAGNOSIS — M5451 Vertebrogenic low back pain: Secondary | ICD-10-CM | POA: Diagnosis not present

## 2023-07-26 ENCOUNTER — Other Ambulatory Visit (INDEPENDENT_AMBULATORY_CARE_PROVIDER_SITE_OTHER)

## 2023-07-26 DIAGNOSIS — I10 Essential (primary) hypertension: Secondary | ICD-10-CM

## 2023-07-26 LAB — COMPREHENSIVE METABOLIC PANEL WITH GFR
ALT: 20 U/L (ref 0–35)
AST: 24 U/L (ref 0–37)
Albumin: 4 g/dL (ref 3.5–5.2)
Alkaline Phosphatase: 71 U/L (ref 39–117)
BUN: 18 mg/dL (ref 6–23)
CO2: 24 meq/L (ref 19–32)
Calcium: 9.4 mg/dL (ref 8.4–10.5)
Chloride: 110 meq/L (ref 96–112)
Creatinine, Ser: 1.24 mg/dL — ABNORMAL HIGH (ref 0.40–1.20)
GFR: 43.98 mL/min — ABNORMAL LOW (ref >60.00)
Glucose, Bld: 102 mg/dL — ABNORMAL HIGH (ref 70–99)
Potassium: 4 meq/L (ref 3.5–5.1)
Sodium: 141 meq/L (ref 135–145)
Total Bilirubin: 0.3 mg/dL (ref 0.2–1.2)
Total Protein: 7.2 g/dL (ref 6.0–8.3)

## 2023-07-27 DIAGNOSIS — M5451 Vertebrogenic low back pain: Secondary | ICD-10-CM | POA: Diagnosis not present

## 2023-08-01 ENCOUNTER — Ambulatory Visit: Payer: Self-pay | Admitting: Internal Medicine

## 2023-08-01 DIAGNOSIS — Z008 Encounter for other general examination: Secondary | ICD-10-CM | POA: Diagnosis not present

## 2023-08-03 DIAGNOSIS — M5451 Vertebrogenic low back pain: Secondary | ICD-10-CM | POA: Diagnosis not present

## 2023-08-05 ENCOUNTER — Encounter: Payer: Self-pay | Admitting: Internal Medicine

## 2023-08-05 ENCOUNTER — Other Ambulatory Visit: Payer: Self-pay | Admitting: Internal Medicine

## 2023-08-05 ENCOUNTER — Ambulatory Visit: Admitting: Internal Medicine

## 2023-08-05 VITALS — BP 128/72 | HR 55 | Temp 97.9°F | Ht 68.0 in | Wt 203.0 lb

## 2023-08-05 DIAGNOSIS — G8929 Other chronic pain: Secondary | ICD-10-CM

## 2023-08-05 DIAGNOSIS — I10 Essential (primary) hypertension: Secondary | ICD-10-CM

## 2023-08-05 DIAGNOSIS — M545 Low back pain, unspecified: Secondary | ICD-10-CM | POA: Diagnosis not present

## 2023-08-05 DIAGNOSIS — N183 Chronic kidney disease, stage 3 unspecified: Secondary | ICD-10-CM | POA: Diagnosis not present

## 2023-08-05 DIAGNOSIS — E785 Hyperlipidemia, unspecified: Secondary | ICD-10-CM | POA: Diagnosis not present

## 2023-08-05 DIAGNOSIS — Z0000 Encounter for general adult medical examination without abnormal findings: Secondary | ICD-10-CM

## 2023-08-05 DIAGNOSIS — N289 Disorder of kidney and ureter, unspecified: Secondary | ICD-10-CM

## 2023-08-05 MED ORDER — PREDNISONE 10 MG PO TABS
10.0000 mg | ORAL_TABLET | Freq: Every day | ORAL | 1 refills | Status: DC
Start: 2023-08-05 — End: 2023-12-06

## 2023-08-05 NOTE — Assessment & Plan Note (Signed)
 -  Hold Lipitor

## 2023-08-05 NOTE — Assessment & Plan Note (Signed)
 Taking Aleve 2 tabs bid - asked to d/c due to CRI Will try Meloxicam 7.5 -15 mg/d

## 2023-08-05 NOTE — Progress Notes (Signed)
 Subjective:  Patient ID: Julia [name], female    DOB: August 31, 1950  Age: 73 y.o. MRN: [id number]  CC: Medical Management of Chronic Issues (6 week f/u)   HPI Kamil Prudhomme-Robinson presents for OA  - much better on Prednisone  10 mg a day  Outpatient Medications Prior to Visit  Medication Sig Dispense Refill   acetaminophen (TYLENOL) 500 MG tablet Take 500 mg by mouth at bedtime as needed. As needed at bedtime     amLODipine  (NORVASC ) 10 MG tablet Take 1 tablet by mouth once daily 90 tablet 3   ASPIRIN 81 PO Take 81 mg by mouth daily in the afternoon.     atorvastatin  (LIPITOR) 40 MG tablet Take 1 tablet by mouth once daily 90 tablet 3   BIOTIN PO Take 1 capsule by mouth daily in the afternoon.     bisoprolol  (ZEBETA ) 5 MG tablet Take 1 tablet by mouth once daily 90 tablet 3   CALCIUM  PO Take 1 capsule by mouth daily in the afternoon.     Cholecalciferol (VITAMIN D3) 50 MCG (2000 UT) capsule Take 1 capsule (2,000 Units total) by mouth daily. 100 capsule 3   Ferrous Gluconate (IRON 27 PO) Take by mouth.     GARLIC PO Take 1 capsule by mouth daily in the afternoon.     Moringa 500 MG CAPS Take 1 capsule by mouth daily.     Multiple Vitamins-Minerals (CENTRUM SILVER PO) Take 1 tablet by mouth daily in the afternoon.     Omega-3 Fatty Acids (FISH OIL PO) Take 1 capsule by mouth daily in the afternoon.     TURMERIC PO Take 1 capsule by mouth daily in the afternoon.     Vitamin D -Vitamin K (VITAMIN K2-VITAMIN D3 PO) Take 5,000 Units by mouth daily in the afternoon.     VITAMIN E PO Take 1 capsule by mouth daily in the afternoon.     predniSONE  (DELTASONE ) 5 MG tablet Take 1-2 tablets (5-10 mg total) by mouth daily with breakfast. 60 tablet 3   Flaxseed, Linseed, (FLAXSEED OIL) 1000 MG CAPS Take 1 capsule by mouth daily in the afternoon.     No facility-administered medications prior to visit.    ROS: Review of Systems  Constitutional:  Positive for fatigue. Negative for  activity change, appetite change, chills and unexpected weight change.  HENT:  Negative for congestion, mouth sores and sinus pressure.   Eyes:  Negative for visual disturbance.  Respiratory:  Negative for cough and chest tightness.   Gastrointestinal:  Negative for abdominal pain and nausea.  Genitourinary:  Negative for difficulty urinating, frequency and vaginal pain.  Musculoskeletal:  Positive for arthralgias, back pain and gait problem.  Skin:  Negative for pallor and rash.  Neurological:  Negative for dizziness, tremors, weakness, numbness and headaches.  Psychiatric/Behavioral:  Negative for confusion, sleep disturbance and suicidal ideas. The patient is not nervous/anxious.     Objective:  BP 128/72 (BP Location: Left Arm, Patient Position: Sitting)   Pulse (!) 55   Temp 97.9 F (36.6 C) (Temporal)   Ht 5' 8 (1.727 m)   Wt 203 lb (92.1 kg)   SpO2 97%   BMI 30.87 kg/m   BP Readings from Last 3 Encounters:  08/05/23 128/72  06/24/23 132/78  05/12/23 120/64    Wt Readings from Last 3 Encounters:  08/05/23 203 lb (92.1 kg)  06/24/23 200 lb 9.6 oz (91 kg)  05/12/23 193 lb (87.5 kg)    Physical Exam Constitutional:  General: She is not in acute distress.    Appearance: She is well-developed. She is obese.  HENT:     Head: Normocephalic.     Right Ear: External ear normal.     Left Ear: External ear normal.     Nose: Nose normal.   Eyes:     General:        Right eye: No discharge.        Left eye: No discharge.     Conjunctiva/sclera: Conjunctivae normal.     Pupils: Pupils are equal, round, and reactive to light.   Neck:     Thyroid : No thyromegaly.     Vascular: No JVD.     Trachea: No tracheal deviation.   Cardiovascular:     Rate and Rhythm: Normal rate and regular rhythm.     Heart sounds: Normal heart sounds.  Pulmonary:     Effort: No respiratory distress.     Breath sounds: No stridor. No wheezing.  Abdominal:     General: Bowel sounds  are normal. There is no distension.     Palpations: Abdomen is soft. There is no mass.     Tenderness: There is no abdominal tenderness. There is no guarding or rebound.   Musculoskeletal:        General: No tenderness.     Cervical back: Normal range of motion and neck supple. No rigidity.  Lymphadenopathy:     Cervical: No cervical adenopathy.   Skin:    Findings: No erythema or rash.   Neurological:     Mental Status: Mental status is at baseline.     Cranial Nerves: No cranial nerve deficit.     Motor: No abnormal muscle tone.     Coordination: Coordination normal.     Gait: Gait abnormal.     Deep Tendon Reflexes: Reflexes normal.   Psychiatric:        Behavior: Behavior normal.        Thought Content: Thought content normal.        Judgment: Judgment normal.   LS, hips w/less pain on ROM  Lab Results  Component Value Date   WBC 4.2 05/12/2023   HGB 13.6 05/12/2023   HCT 40.6 05/12/2023   PLT 178.0 05/12/2023   GLUCOSE 102 (H) 07/26/2023   CHOL 204 (H) 05/12/2022   TRIG 104.0 05/12/2022   HDL 72.50 05/12/2022   LDLCALC 110 (H) 05/12/2022   ALT 20 07/26/2023   AST 24 07/26/2023   NA 141 07/26/2023   K 4.0 07/26/2023   CL 110 07/26/2023   CREATININE 1.24 (H) 07/26/2023   BUN 18 07/26/2023   CO2 24 07/26/2023   TSH 0.72 01/12/2023    MM 3D DIAGNOSTIC MAMMOGRAM UNILATERAL RIGHT BREAST Result Date: 01/13/2023 CLINICAL DATA:  Screening recall for possible right breast asymmetry EXAM: DIGITAL DIAGNOSTIC UNILATERAL RIGHT MAMMOGRAM WITH TOMOSYNTHESIS AND CAD TECHNIQUE: Right digital diagnostic mammography and breast tomosynthesis was performed. The images were evaluated with computer-aided detection. COMPARISON:  Previous exam(s). ACR Breast Density Category b: There are scattered areas of fibroglandular density. FINDINGS: The previously described possible asymmetry in the inner right breast does not persist with additional views, consistent with superimposed  fibroglandular tissue. No suspicious mass, microcalcification, or other finding is identified. IMPRESSION: The previously described possible asymmetry does not persist with additional views, consistent with superimposed fibroglandular tissue. No mammographic evidence of malignancy in the right breast. RECOMMENDATION: Return to routine screening mammography is recommended. The patient will be due for  screening in November 2025. I have discussed the findings and recommendations with the patient. If applicable, a reminder letter will be sent to the patient regarding the next appointment. BI-RADS CATEGORY  1: Negative. Electronically Signed   By: Sande Cromer M.D.   On: 01/13/2023 11:14    Assessment & Plan:   Problem List Items Addressed This Visit     HTN (hypertension)   Cont on Bisoprolol , Amlodipine       Relevant Orders   Comprehensive metabolic panel with GFR   CBC with Differential/Platelet   Dyslipidemia   Hold Lipitor      Healthcare maintenance   Renal insufficiency   GFR 43 D/c naproxen, hydrate well Will try Meloxicam  7.5 -15 mg/d      CRI (chronic renal insufficiency), stage 3 (moderate) (HCC)   Taking Aleve 2 tabs bid - asked to d/c due to CRI Will try Meloxicam  7.5 -15 mg/d      Relevant Orders   Comprehensive metabolic panel with GFR   CBC with Differential/Platelet   Low back pain - Primary   [employer]: Infrared Sauna Blanket-Sauna Blanket for Home Use On Meloxicam  F/u w/Dr Abigail Abler   Lido patches $$$ Pt declined opioids, gabapentin, muscle relaxers Will use low dose steroids - Prednisone  5-10 mg/d  Potential benefits of a long term steroid  use as well as potential risks  and complications were explained to the patient and were aknowledged. D/c meloxicam  Much better on Prednisone  10 mg a day (5 mg is not as good)  Potential benefits of a long term steroid  use as well as potential risks  and complications were explained to the patient and were  aknowledged.        Relevant Medications   predniSONE  (DELTASONE ) 10 MG tablet      Meds ordered this encounter  Medications   predniSONE  (DELTASONE ) 10 MG tablet    Sig: Take 1 tablet (10 mg total) by mouth daily with breakfast. Take pc.    Dispense:  90 tablet    Refill:  1      Follow-up: Return in about 3 months (around 11/05/2023) for a follow-up visit.  Anitra Barn, MD

## 2023-08-05 NOTE — Assessment & Plan Note (Signed)
 [employer]: Infrared Sauna Blanket-Sauna Blanket for Home Use On Meloxicam  F/u w/Dr Abigail Abler   Lido patches $$$ Pt declined opioids, gabapentin, muscle relaxers Will use low dose steroids - Prednisone  5-10 mg/d  Potential benefits of a long term steroid  use as well as potential risks  and complications were explained to the patient and were aknowledged. D/c meloxicam  Much better on Prednisone  10 mg a day (5 mg is not as good)  Potential benefits of a long term steroid  use as well as potential risks  and complications were explained to the patient and were aknowledged.

## 2023-08-05 NOTE — Assessment & Plan Note (Signed)
 GFR 43 D/c naproxen, hydrate well Will try Meloxicam 7.5 -15 mg/d

## 2023-08-05 NOTE — Assessment & Plan Note (Signed)
Cont on Bisoprolol, Amlodipine 

## 2023-08-20 DIAGNOSIS — M5451 Vertebrogenic low back pain: Secondary | ICD-10-CM | POA: Diagnosis not present

## 2023-08-24 DIAGNOSIS — M5451 Vertebrogenic low back pain: Secondary | ICD-10-CM | POA: Diagnosis not present

## 2023-08-28 DIAGNOSIS — M545 Low back pain, unspecified: Secondary | ICD-10-CM | POA: Diagnosis not present

## 2023-09-10 DIAGNOSIS — M545 Low back pain, unspecified: Secondary | ICD-10-CM | POA: Diagnosis not present

## 2023-10-07 DIAGNOSIS — M5414 Radiculopathy, thoracic region: Secondary | ICD-10-CM | POA: Diagnosis not present

## 2023-10-26 DIAGNOSIS — M48061 Spinal stenosis, lumbar region without neurogenic claudication: Secondary | ICD-10-CM | POA: Diagnosis not present

## 2023-11-15 ENCOUNTER — Other Ambulatory Visit: Payer: Self-pay | Admitting: Internal Medicine

## 2023-11-15 DIAGNOSIS — Z1231 Encounter for screening mammogram for malignant neoplasm of breast: Secondary | ICD-10-CM

## 2023-11-17 DIAGNOSIS — M5416 Radiculopathy, lumbar region: Secondary | ICD-10-CM | POA: Diagnosis not present

## 2023-11-24 DIAGNOSIS — M7542 Impingement syndrome of left shoulder: Secondary | ICD-10-CM | POA: Diagnosis not present

## 2023-11-24 DIAGNOSIS — M7541 Impingement syndrome of right shoulder: Secondary | ICD-10-CM | POA: Diagnosis not present

## 2023-12-06 ENCOUNTER — Encounter: Payer: Self-pay | Admitting: Internal Medicine

## 2023-12-06 ENCOUNTER — Ambulatory Visit: Admitting: Internal Medicine

## 2023-12-06 VITALS — BP 140/84 | HR 79 | Temp 98.2°F | Ht 68.0 in | Wt 210.0 lb

## 2023-12-06 DIAGNOSIS — M170 Bilateral primary osteoarthritis of knee: Secondary | ICD-10-CM | POA: Diagnosis not present

## 2023-12-06 DIAGNOSIS — G8929 Other chronic pain: Secondary | ICD-10-CM | POA: Diagnosis not present

## 2023-12-06 DIAGNOSIS — R748 Abnormal levels of other serum enzymes: Secondary | ICD-10-CM | POA: Diagnosis not present

## 2023-12-06 DIAGNOSIS — I10 Essential (primary) hypertension: Secondary | ICD-10-CM | POA: Diagnosis not present

## 2023-12-06 DIAGNOSIS — N2889 Other specified disorders of kidney and ureter: Secondary | ICD-10-CM

## 2023-12-06 DIAGNOSIS — M545 Low back pain, unspecified: Secondary | ICD-10-CM | POA: Diagnosis not present

## 2023-12-06 LAB — COMPREHENSIVE METABOLIC PANEL WITH GFR
ALT: 18 U/L (ref 0–35)
AST: 23 U/L (ref 0–37)
Albumin: 4.3 g/dL (ref 3.5–5.2)
Alkaline Phosphatase: 70 U/L (ref 39–117)
BUN: 22 mg/dL (ref 6–23)
CO2: 26 meq/L (ref 19–32)
Calcium: 10.1 mg/dL (ref 8.4–10.5)
Chloride: 105 meq/L (ref 96–112)
Creatinine, Ser: 1.11 mg/dL (ref 0.40–1.20)
GFR: 50.1 mL/min — ABNORMAL LOW (ref >60.00)
Glucose, Bld: 98 mg/dL (ref 70–99)
Potassium: 3.9 meq/L (ref 3.5–5.1)
Sodium: 139 meq/L (ref 135–145)
Total Bilirubin: 0.5 mg/dL (ref 0.2–1.2)
Total Protein: 7.5 g/dL (ref 6.0–8.3)

## 2023-12-06 LAB — CBC WITH DIFFERENTIAL/PLATELET
Basophils Absolute: 0 K/uL (ref 0.0–0.1)
Basophils Relative: 0.8 % (ref 0.0–3.0)
Eosinophils Absolute: 0.2 K/uL (ref 0.0–0.7)
Eosinophils Relative: 3.9 % (ref 0.0–5.0)
HCT: 40.9 % (ref 36.0–46.0)
Hemoglobin: 13.6 g/dL (ref 12.0–15.0)
Lymphocytes Relative: 26.9 % (ref 12.0–46.0)
Lymphs Abs: 1.3 K/uL (ref 0.7–4.0)
MCHC: 33.2 g/dL (ref 30.0–36.0)
MCV: 92.5 fl (ref 78.0–100.0)
Monocytes Absolute: 0.5 K/uL (ref 0.1–1.0)
Monocytes Relative: 10.9 % (ref 3.0–12.0)
Neutro Abs: 2.8 K/uL (ref 1.4–7.7)
Neutrophils Relative %: 57.5 % (ref 43.0–77.0)
Platelets: 202 K/uL (ref 150.0–400.0)
RBC: 4.42 Mil/uL (ref 3.87–5.11)
RDW: 13.3 % (ref 11.5–15.5)
WBC: 4.8 K/uL (ref 4.0–10.5)

## 2023-12-06 MED ORDER — ACETAMINOPHEN-CODEINE 300-30 MG PO TABS: 1.0000 | ORAL_TABLET | Freq: Four times a day (QID) | ORAL | 1 refills | Status: DC | PRN

## 2023-12-06 NOTE — Assessment & Plan Note (Signed)
 F/u w/Dr Beverley. Seeing Ortho/Pain management for LBP

## 2023-12-06 NOTE — Progress Notes (Signed)
 Subjective:  Patient ID: Julia [name], female    DOB: [birth year]  Age: 73 y.o. MRN: [id number]  CC: Medical Management of Chronic Issues (4 Month follow up. FYI of recent injections for chronic pain, lower back pain, LR shoulders, T11 and T12)   HPI Julia [name] presents for LBP, OA Pt stopped steroids in July Seeing Ortho/Pain management for LBP  Outpatient Medications Prior to Visit  Medication Sig Dispense Refill   acetaminophen (TYLENOL) 500 MG tablet Take 500 mg by mouth at bedtime as needed. As needed at bedtime     amLODipine  (NORVASC ) 10 MG tablet Take 1 tablet by mouth once daily 90 tablet 3   ASPIRIN 81 PO Take 81 mg by mouth daily in the afternoon.     atorvastatin  (LIPITOR) 40 MG tablet Take 1 tablet by mouth once daily 90 tablet 3   BIOTIN PO Take 1 capsule by mouth daily in the afternoon.     bisoprolol  (ZEBETA ) 5 MG tablet Take 1 tablet by mouth once daily 90 tablet 3   CALCIUM  PO Take 1 capsule by mouth daily in the afternoon.     Cholecalciferol (VITAMIN D3) 50 MCG (2000 UT) capsule Take 1 capsule (2,000 Units total) by mouth daily. 100 capsule 3   Ferrous Gluconate (IRON 27 PO) Take by mouth.     GARLIC PO Take 1 capsule by mouth daily in the afternoon.     meloxicam  (MOBIC ) 7.5 MG tablet Take 7.5-15 mg by mouth daily as needed.     Moringa 500 MG CAPS Take 1 capsule by mouth daily.     Multiple Vitamins-Minerals (CENTRUM SILVER PO) Take 1 tablet by mouth daily in the afternoon.     Omega-3 Fatty Acids (FISH OIL PO) Take 1 capsule by mouth daily in the afternoon.     tiZANidine (ZANAFLEX) 2 MG tablet Take 2 mg by mouth 2 (two) times daily.     TURMERIC PO Take 1 capsule by mouth daily in the afternoon.     Vitamin D -Vitamin K (VITAMIN K2-VITAMIN D3 PO) Take 5,000 Units by mouth daily in the afternoon.     VITAMIN E PO Take 1 capsule by mouth daily in the afternoon.     Flaxseed, Linseed, (FLAXSEED OIL) 1000 MG CAPS Take 1 capsule by mouth daily  in the afternoon.     predniSONE  (DELTASONE ) 10 MG tablet Take 1 tablet (10 mg total) by mouth daily with breakfast. Take pc. 90 tablet 1   No facility-administered medications prior to visit.    ROS: Review of Systems  Constitutional:  Positive for unexpected weight change. Negative for activity change, appetite change, chills and fatigue.  HENT:  Negative for congestion, mouth sores and sinus pressure.   Eyes:  Negative for visual disturbance.  Respiratory:  Negative for cough and chest tightness.   Cardiovascular:  Negative for leg swelling.  Gastrointestinal:  Negative for abdominal pain and nausea.  Genitourinary:  Negative for difficulty urinating, frequency and vaginal pain.  Musculoskeletal:  Positive for arthralgias, back pain, gait problem and neck stiffness.  Skin:  Negative for pallor and rash.  Neurological:  Negative for dizziness, tremors, weakness, numbness and headaches.  Psychiatric/Behavioral:  Negative for confusion, dysphoric mood and sleep disturbance.     Objective:  BP (!) 140/84   Pulse 79   Temp 98.2 F (36.8 C)   Ht 5' 8 (1.727 m)   Wt 210 lb (95.3 kg)   SpO2 99%   BMI 31.93 kg/m  BP Readings from Last 3 Encounters:  12/06/23 (!) 140/84  08/05/23 128/72  06/24/23 132/78    Wt Readings from Last 3 Encounters:  12/06/23 210 lb (95.3 kg)  08/05/23 203 lb (92.1 kg)  06/24/23 200 lb 9.6 oz (91 kg)    Physical Exam Constitutional:      General: She is not in acute distress.    Appearance: She is well-developed. She is obese.  HENT:     Head: Normocephalic.     Right Ear: External ear normal.     Left Ear: External ear normal.     Nose: Nose normal.  Eyes:     General:        Right eye: No discharge.        Left eye: No discharge.     Conjunctiva/sclera: Conjunctivae normal.     Pupils: Pupils are equal, round, and reactive to light.  Neck:     Thyroid : No thyromegaly.     Vascular: No JVD.     Trachea: No tracheal deviation.   Cardiovascular:     Rate and Rhythm: Normal rate and regular rhythm.     Heart sounds: Normal heart sounds.  Pulmonary:     Effort: No respiratory distress.     Breath sounds: No stridor. No wheezing.  Abdominal:     General: Bowel sounds are normal. There is no distension.     Palpations: Abdomen is soft. There is no mass.     Tenderness: There is no abdominal tenderness. There is no guarding or rebound.  Musculoskeletal:        General: Tenderness present.     Cervical back: Normal range of motion and neck supple. No rigidity.  Lymphadenopathy:     Cervical: No cervical adenopathy.  Skin:    Findings: No erythema or rash.  Neurological:     Mental Status: She is oriented to person, place, and time.     Cranial Nerves: No cranial nerve deficit.     Motor: No abnormal muscle tone.     Coordination: Coordination normal.     Gait: Gait abnormal.     Deep Tendon Reflexes: Reflexes normal.  Psychiatric:        Behavior: Behavior normal.        Thought Content: Thought content normal.        Judgment: Judgment normal.   L S w/pain  Lab Results  Component Value Date   WBC 4.2 05/12/2023   HGB 13.6 05/12/2023   HCT 40.6 05/12/2023   PLT 178.0 05/12/2023   GLUCOSE 102 (H) 07/26/2023   CHOL 204 (H) 05/12/2022   TRIG 104.0 05/12/2022   HDL 72.50 05/12/2022   LDLCALC 110 (H) 05/12/2022   ALT 20 07/26/2023   AST 24 07/26/2023   NA 141 07/26/2023   K 4.0 07/26/2023   CL 110 07/26/2023   CREATININE 1.24 (H) 07/26/2023   BUN 18 07/26/2023   CO2 24 07/26/2023   TSH 0.72 01/12/2023    MM 3D DIAGNOSTIC MAMMOGRAM UNILATERAL RIGHT BREAST Result Date: 01/13/2023 CLINICAL DATA:  Screening recall for possible right breast asymmetry EXAM: DIGITAL DIAGNOSTIC UNILATERAL RIGHT MAMMOGRAM WITH TOMOSYNTHESIS AND CAD TECHNIQUE: Right digital diagnostic mammography and breast tomosynthesis was performed. The images were evaluated with computer-aided detection. COMPARISON:  Previous exam(s).  ACR Breast Density Category b: There are scattered areas of fibroglandular density. FINDINGS: The previously described possible asymmetry in the inner right breast does not persist with additional views, consistent with superimposed fibroglandular tissue.  No suspicious mass, microcalcification, or other finding is identified. IMPRESSION: The previously described possible asymmetry does not persist with additional views, consistent with superimposed fibroglandular tissue. No mammographic evidence of malignancy in the right breast. RECOMMENDATION: Return to routine screening mammography is recommended. The patient will be due for screening in November 2025. I have discussed the findings and recommendations with the patient. If applicable, a reminder letter will be sent to the patient regarding the next appointment. BI-RADS CATEGORY  1: Negative. Electronically Signed   By: Dirk Arrant M.D.   On: 01/13/2023 11:14    Assessment & Plan:   Problem List Items Addressed This Visit     CRI (chronic renal insufficiency), stage 3 (moderate) - Primary   Meloxicam  7.5 -15 mg/d - rarely due to CRI      Elevated liver enzymes   Meloxicam  7.5 -15 mg/d - rarely due to CRI      Low back pain   Pt declined opioids - side effects, gabapentin, muscle relaxers Pt stopped low dose steroids - Prednisone  5-10 mg/d Use less meloxicam  due to CRI Options discussed. We can try Gabapentin - declined F/u w/Dr Beverley. Seeing Ortho/Pain management for LBP      Relevant Medications   tiZANidine (ZANAFLEX) 2 MG tablet   meloxicam  (MOBIC ) 7.5 MG tablet   acetaminophen-codeine (TYLENOL #3) 300-30 MG tablet   Osteoarthritis   F/u w/Dr Beverley. Seeing Ortho/Pain management for LBP      Relevant Medications   tiZANidine (ZANAFLEX) 2 MG tablet   meloxicam  (MOBIC ) 7.5 MG tablet   acetaminophen-codeine (TYLENOL #3) 300-30 MG tablet      Meds ordered this encounter  Medications   acetaminophen-codeine (TYLENOL #3)  300-30 MG tablet    Sig: Take 1 tablet by mouth every 6 (six) hours as needed for severe pain (pain score 7-10).    Dispense:  20 tablet    Refill:  1    Code: M54.50 -  LBP      Follow-up: Return in about 3 months (around 03/07/2024) for a follow-up visit.  Marolyn Noel, MD

## 2023-12-06 NOTE — Assessment & Plan Note (Signed)
 Meloxicam  7.5 -15 mg/d - rarely due to CRI

## 2023-12-06 NOTE — Assessment & Plan Note (Addendum)
 Pt declined opioids - side effects, gabapentin, muscle relaxers Pt stopped low dose steroids - Prednisone  5-10 mg/d Use less meloxicam  due to CRI Options discussed. We can try Gabapentin - declined F/u w/Dr Beverley. Seeing Ortho/Pain management for LBP

## 2023-12-12 ENCOUNTER — Ambulatory Visit: Payer: Self-pay | Admitting: Internal Medicine

## 2023-12-20 DIAGNOSIS — M5416 Radiculopathy, lumbar region: Secondary | ICD-10-CM | POA: Diagnosis not present

## 2023-12-27 ENCOUNTER — Ambulatory Visit
Admission: RE | Admit: 2023-12-27 | Discharge: 2023-12-27 | Disposition: A | Discharge status: Home / Self Care | Source: Ambulatory Visit | Attending: Internal Medicine | Admitting: Internal Medicine

## 2023-12-27 DIAGNOSIS — Z1231 Encounter for screening mammogram for malignant neoplasm of breast: Secondary | ICD-10-CM

## 2024-01-10 ENCOUNTER — Other Ambulatory Visit: Payer: Self-pay | Admitting: Internal Medicine

## 2024-01-11 ENCOUNTER — Telehealth: Payer: Self-pay

## 2024-01-11 NOTE — Telephone Encounter (Signed)
 Copied from CRM [id number]. Topic: Clinical - Medication Question >> Jan 11, 2024  2:05 PM Deaijah H wrote: Reason for CRM: Ryan w/ [facility] called in to find out if patient will be on medication  acetaminophen-codeine (TYLENOL #3) 300-30 MG tablet chronically or temporarily. Please CALL [phone]

## 2024-01-13 ENCOUNTER — Telehealth: Payer: Self-pay

## 2024-01-13 NOTE — Telephone Encounter (Signed)
 Spoke with the pharmacist and was able to inform him of the following per PCP Probably chronically (if tolerated).

## 2024-01-13 NOTE — Telephone Encounter (Signed)
 Probably chronically (if tolerated). Thx

## 2024-01-13 NOTE — Telephone Encounter (Signed)
 Copied from CRM [id number]. Topic: Clinical - Medication Prior Auth >> Jan 13, 2024  2:23 PM Sophia H wrote: Reason for CRM:  [name] - [employer] is calling to check on a fax that was sent in on 11/17 - states it is regarding a prior auth that is needed for a back brace. Please be on look out - will be re faxing.  [name] states provider just needs to sign off on the PA, will need clinical notes from last 2 appointments. Please reach out if any questions # [phone], FAX # [phone]  Confirmed clinic fax # [phone]

## 2024-01-14 NOTE — Telephone Encounter (Signed)
 Fax has been placed on PCP desk.

## 2024-01-27 ENCOUNTER — Telehealth: Payer: Self-pay

## 2024-01-27 NOTE — Telephone Encounter (Signed)
 Copied from CRM [id number]. Topic: Clinical - Medication Prior Auth >> Jan 13, 2024  2:23 PM Sophia H wrote: Reason for CRM:  [name] - [employer] is calling to check on a fax that was sent in on 11/17 - states it is regarding a prior auth that is needed for a back brace. Please be on look out - will be re faxing.  [name] states provider just needs to sign off on the PA, will need clinical notes from last 2 appointments. Please reach out if any questions # [phone], FAX # [phone]  Confirmed clinic fax # [phone] >> Jan 27, 2024 11:13 AM Franky GRADE wrote: [name] from [employer] is calling to follow up as she has called multiple times and has no gotten the paper work or any updates. Best call back number is [phone]. Urgent matter  >> Jan 14, 2024  3:44 PM Pinkey ORN wrote: [name] on behalf of patient, wanting to know if the fax had been received. I informed her of the message shown below:  Julia [name], [facility]    01/14/24 10:02 AM Note Fax has been placed on PCP desk.

## 2024-01-28 ENCOUNTER — Other Ambulatory Visit: Payer: Self-pay | Admitting: Internal Medicine

## 2024-01-31 DIAGNOSIS — H25013 Cortical age-related cataract, bilateral: Secondary | ICD-10-CM | POA: Diagnosis not present

## 2024-01-31 DIAGNOSIS — H2513 Age-related nuclear cataract, bilateral: Secondary | ICD-10-CM | POA: Diagnosis not present

## 2024-01-31 NOTE — Telephone Encounter (Unsigned)
 Copied from CRM #[id number]. Topic: Clinical - Medication Prior Auth >> Jan 31, 2024  3:45 PM Fonda T wrote: Reason for CRM: [name] calling from [facility], calling to check on a fax that was sent in on 11/17 - states it is regarding a prior auth that is needed for a back brace.   Caller states she has called multiple times with no response from office and now needs this information as soon as possible.  Caller states she refuses to leave another message, but needs someone to follow up with her as soon as possible.  Contact attempts made on 01/14/24, 01/14/24, 01/27/24, and now today.   Called and spoke to office, Rocky, advised to send another message for follow up call back to [name].  CB# [phone]

## 2024-02-01 NOTE — Telephone Encounter (Signed)
 I was able to contact the pt and confirm she is indeed trying to get a back brace.  I have reached out to [facility] and requested that she re-fax the orders ATTN to myslef Merwyn) so that the PCP can sign off on them.

## 2024-02-04 NOTE — Telephone Encounter (Signed)
 I was able to obtain this fax today and have placed it for PCP to sign upon his return to the office next week.

## 2024-02-04 NOTE — Telephone Encounter (Unsigned)
 Copied from CRM #[id number]. Topic: General - Other >> Feb 04, 2024 10:50 AM Berneda FALCON wrote: Reason for CRM: [name] from [facility] [employer] would like to make sure that Hadassah got the fax she sent back over on 12/9. I was unable to reach anyone via CAL. She states this is urgent and has been calling since November and need this before end of the week.  Please call her to confirm [phone]

## 2024-02-08 NOTE — Telephone Encounter (Unsigned)
 Copied from CRM #[id number]. Topic: General - Other >> Feb 08, 2024 11:11 AM Revonda D wrote: Reason for CRM: Julia [name] - [employer] is calling to check on a fax that was sent in on 11/17 - states it is regarding a prior auth that is needed for a back brace. Please be on look out - will be re faxing.  Julia [name] states provider just needs to sign off on the PA, will need clinical notes from last 2 appointments. Please reach out if any questions # [phone], FAX # [phone]  UPDATE: Julia [name] is calling back to confirm if the office received the forms faxed. I informed Julia [name] that the forms were received on 12/12 and was put in the providers box. Julia [name] would like a callback with an update today if possible.

## 2024-02-11 NOTE — Telephone Encounter (Signed)
 [name] called because the form is missing the date of signature and the last two office notes. She asked for both Hadassah and Clotilda to speak too about this.

## 2024-02-11 NOTE — Telephone Encounter (Signed)
 She stated she faxed it again as well.

## 2024-02-14 NOTE — Telephone Encounter (Unsigned)
 Copied from CRM #[id number]. Topic: General - Other >> Feb 08, 2024 11:11 AM Revonda D wrote: Reason for CRM: [name] - [employer] is calling to check on a fax that was sent in on 11/17 - states it is regarding a prior auth that is needed for a back brace. Please be on look out - will be re faxing.  [name] states provider just needs to sign off on the PA, will need clinical notes from last 2 appointments. Please reach out if any questions # [phone], FAX # [phone]  UPDATE: [name] is calling back to confirm if the office received the forms faxed. I informed [name] that the forms were received on 12/12 and was put in the providers box. [name] would like a callback with an update today if possible. >> Feb 14, 2024 10:03 AM Emylou G wrote: [name] w/[employer].. did refax Friday for corrections.. see attached details.. Notes on the 19th.  She needs date of signature - the clinical notes of last 2 appts. Can Maria pls call her back asap.. that is who she sent attn to on the refax on Friday.SABRA [name] number is [phone]

## 2024-02-14 NOTE — Telephone Encounter (Signed)
 Provider has not returned forms.

## 2024-03-02 NOTE — Telephone Encounter (Signed)
 [name] from [facility] [employer] called requesting to speak with manager regarding back brace. I asked what she needs from us  and she states she needs last two office notes faxed. She states they have already received the signed forms. I faxed the last two office notes to [name] at [phone] as requested.

## 2024-03-08 ENCOUNTER — Ambulatory Visit: Admitting: Internal Medicine

## 2024-03-08 ENCOUNTER — Encounter: Payer: Self-pay | Admitting: *Deleted

## 2024-03-08 NOTE — Progress Notes (Signed)
 Julia [name]                                          MRN: [id number]   03/08/2024   The [facility] Quality Team Specialist reviewed this patient medical record for the purposes of chart review for care gap closure. The following were reviewed: chart review for care gap closure-controlling blood pressure.    [facility] Quality Team

## 2024-04-03 ENCOUNTER — Ambulatory Visit: Admitting: Internal Medicine

## 2024-05-15 ENCOUNTER — Encounter: Admitting: Internal Medicine

## 2024-05-15 ENCOUNTER — Ambulatory Visit
